# Patient Record
Sex: Female | Born: 1970 | Race: White | Hispanic: No | Smoking: Never smoker
Health system: Southern US, Community
[De-identification: ages and names within clinical notes are randomized; demographics above are authoritative.]

## PROBLEM LIST (undated history)

## (undated) DIAGNOSIS — F419 Anxiety disorder, unspecified: Secondary | ICD-10-CM

## (undated) DIAGNOSIS — K602 Anal fissure, unspecified: Secondary | ICD-10-CM

## (undated) DIAGNOSIS — K589 Irritable bowel syndrome without diarrhea: Secondary | ICD-10-CM

## (undated) HISTORY — DX: Anxiety disorder, unspecified: F41.9

## (undated) HISTORY — PX: HYSTEROSCOPY WITH D & C: SHX1775

## (undated) HISTORY — DX: Anal fissure, unspecified: K60.2

## (undated) HISTORY — DX: Irritable bowel syndrome, unspecified: K58.9

---

## 2001-02-19 ENCOUNTER — Other Ambulatory Visit: Admission: RE | Admit: 2001-02-19 | Discharge: 2001-02-19 | Payer: Self-pay | Admitting: Obstetrics & Gynecology

## 2002-03-10 ENCOUNTER — Other Ambulatory Visit: Admission: RE | Admit: 2002-03-10 | Discharge: 2002-03-10 | Payer: Self-pay | Admitting: Obstetrics and Gynecology

## 2002-08-05 ENCOUNTER — Other Ambulatory Visit: Admission: RE | Admit: 2002-08-05 | Discharge: 2002-08-05 | Payer: Self-pay | Admitting: Obstetrics and Gynecology

## 2002-08-10 ENCOUNTER — Inpatient Hospital Stay (HOSPITAL_COMMUNITY): Admission: AD | Admit: 2002-08-10 | Discharge: 2002-08-10 | Payer: Self-pay | Admitting: Obstetrics and Gynecology

## 2002-08-17 ENCOUNTER — Ambulatory Visit (HOSPITAL_COMMUNITY): Admission: RE | Admit: 2002-08-17 | Discharge: 2002-08-17 | Payer: Self-pay | Admitting: Obstetrics and Gynecology

## 2002-08-17 ENCOUNTER — Encounter: Payer: Self-pay | Admitting: Obstetrics and Gynecology

## 2003-01-19 ENCOUNTER — Inpatient Hospital Stay (HOSPITAL_COMMUNITY): Admission: AD | Admit: 2003-01-19 | Discharge: 2003-01-19 | Payer: Self-pay | Admitting: Obstetrics and Gynecology

## 2003-03-27 ENCOUNTER — Inpatient Hospital Stay (HOSPITAL_COMMUNITY): Admission: AD | Admit: 2003-03-27 | Discharge: 2003-03-28 | Payer: Self-pay | Admitting: Obstetrics and Gynecology

## 2003-11-11 ENCOUNTER — Other Ambulatory Visit: Admission: RE | Admit: 2003-11-11 | Discharge: 2003-11-11 | Payer: Self-pay | Admitting: Obstetrics and Gynecology

## 2004-08-30 ENCOUNTER — Other Ambulatory Visit: Admission: RE | Admit: 2004-08-30 | Discharge: 2004-08-30 | Payer: Self-pay | Admitting: Obstetrics and Gynecology

## 2005-05-30 ENCOUNTER — Ambulatory Visit: Payer: Self-pay | Admitting: Family Medicine

## 2005-12-12 ENCOUNTER — Other Ambulatory Visit: Admission: RE | Admit: 2005-12-12 | Discharge: 2005-12-12 | Payer: Self-pay | Admitting: Obstetrics and Gynecology

## 2007-12-22 ENCOUNTER — Ambulatory Visit (HOSPITAL_COMMUNITY): Admission: RE | Admit: 2007-12-22 | Discharge: 2007-12-22 | Payer: Self-pay | Admitting: Neurosurgery

## 2008-06-25 HISTORY — PX: NECK SURGERY: SHX720

## 2009-09-21 IMAGING — CR DG CERVICAL SPINE 2 OR 3 VIEWS
1 series · 1 of 1 positions shown · non-contrast
Comparison: None

CLINICAL DATA: Cervical spinal stenosis

CERVICAL SPINE - 2-3 VIEW

[view not recorded]
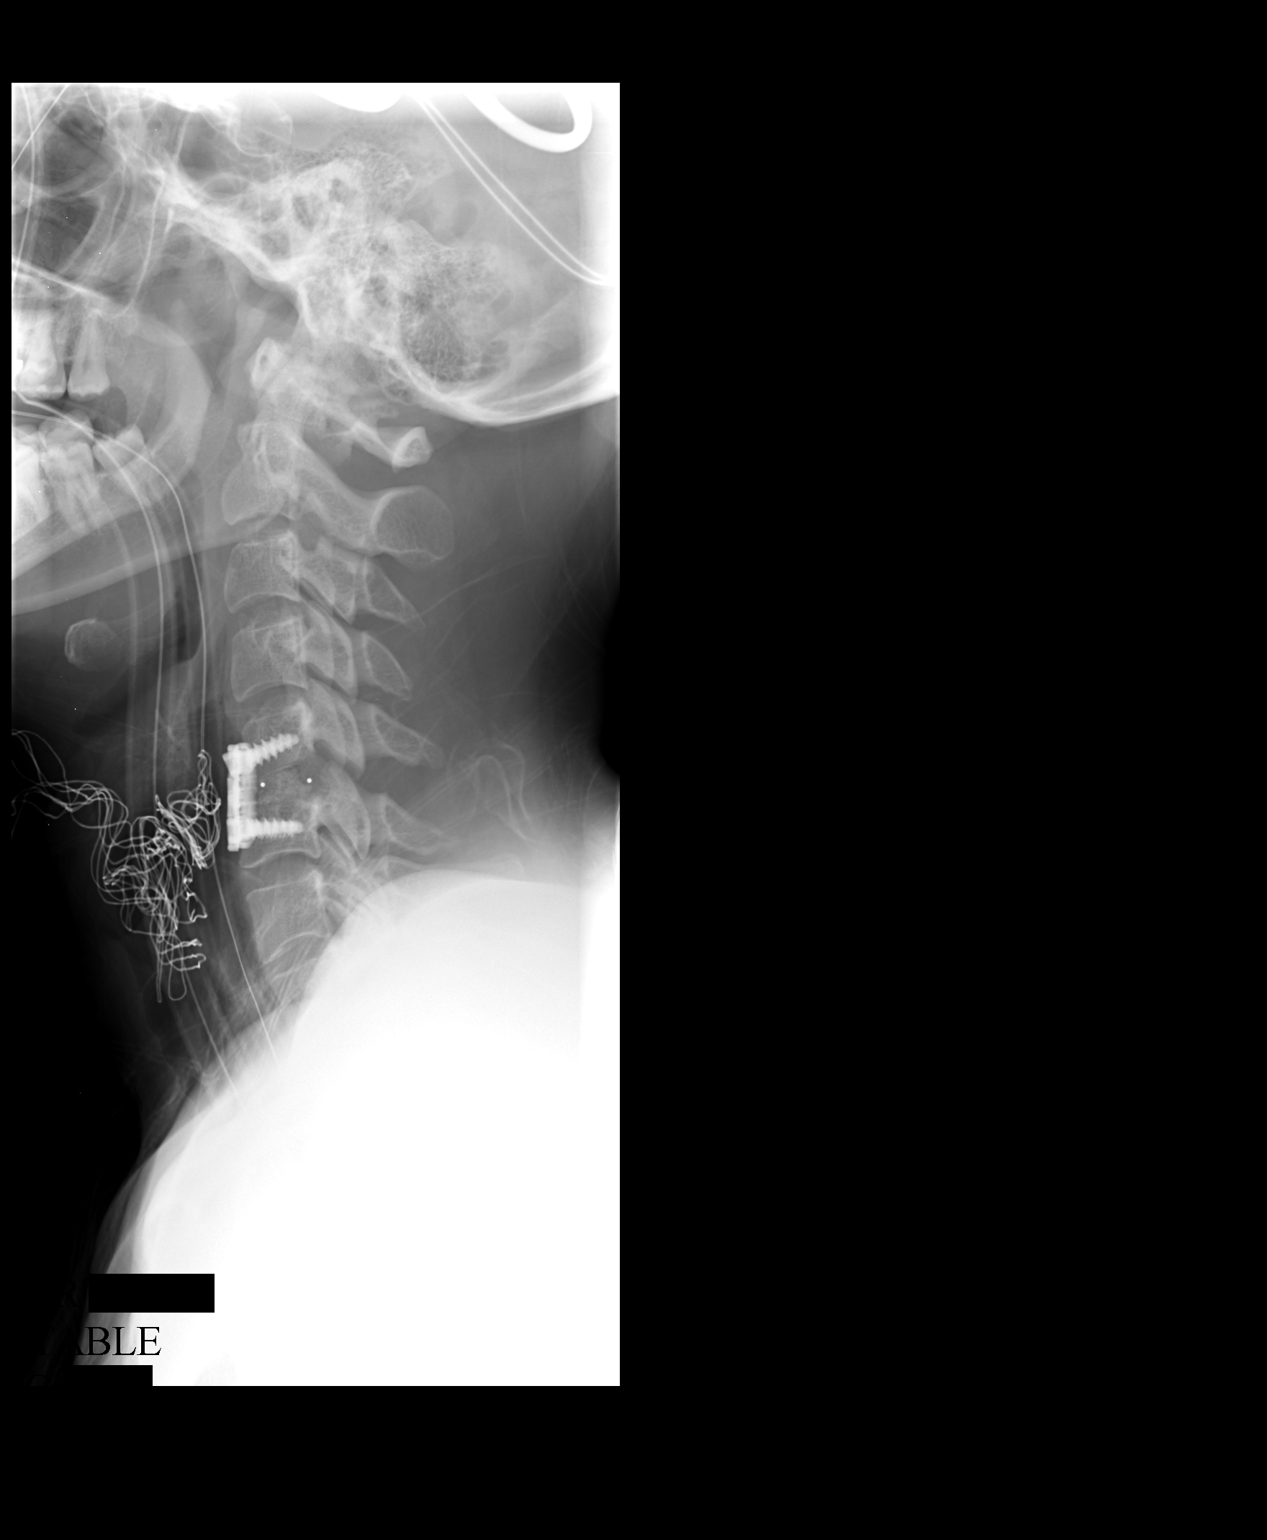

[1 of 1 positions shown; findings below may reference images not displayed]

FINDINGS: The radiograph #1 demonstrates a localization needle
anteriorly at C5-6.  Radiograph #2 at [DATE] a.m. demonstrates the
patient has undergone anterior cervical fusion at C5-6.  Anterior
plate, screws, and interbody fusion device are present.  There is
slight widening of the interspinous distance at  C5-6 posteriorly.
IMPRESSION: Anterior cervical fusion performed at C5-6.

## 2010-11-07 NOTE — Op Note (Signed)
[name], [name]        ACCOUNT NO.:  [id number]   MEDICAL RECORD NO.:  [id number]          PATIENT TYPE:  OIB   LOCATION:  3533                         FACILITY:  [facility]   PHYSICIAN:  Cristi Loron, M.D.DATE OF BIRTH:  [birth year]   DATE OF PROCEDURE:  12/22/2007  DATE OF DISCHARGE:  12/22/2007                               OPERATIVE REPORT   BRIEF HISTORY:  The patient is a 40 year old white female who suffered  from neck and right arm pain consistent with a right C6 radiculopathy.  She failed medical management and worked up with a cervical MRI which  demonstrated herniated disk at C5-6.  I discussed various treatment  options with the patient including surgery.  She has weighed the risks,  benefits, and alternatives of surgery, so I proceeded with C5-6 anterior  cervical diskectomy fusion and plating.   PREOPERATIVE DIAGNOSES:  C5-6 herniated nucleus pulposus, spinal  stenosis, cervical radiculopathy, osteomyelopathy, and cervicalgia.   POSTOPERATIVE DIAGNOSES:  C5-6 herniated nucleus pulposus, spinal  stenosis, cervical radiculopathy, osteomyelopathy, and cervicalgia.   PROCEDURE:  C5-6 extensive anterior cervical diskectomy/decompression;  C5-6 anterior interbody arthrodesis with local autograft bone and active  fused bone graft extender; insertion of C5-6 interbody prosthesis  (Alphatec PEEK interbody prosthesis); C5-6 anterior cervical plating  with Codman slim lock anterior cervical titanium plate and screws.   SURGEON:  Cristi Loron, MD   ASSISTANT:  Stefani Dama, MD   ANESTHESIA:  General endotracheal.   BLOOD LOSS:  50 mL.   SPECIMENS:  None.   DRAINS:  None.   COMPLICATIONS:  None.   DESCRIPTION OF PROCEDURE:  The patient was brought to the operating room  by the anesthesia team.  General endotracheal anesthesia was induced.  The patient remained in supine position.  A roll was placed in her  shoulders to place her neck in slight  extension.  Her anterior cervical  region was then prepared with Betadine scrub and Betadine solution.  Sterile drapes were applied.  I then injected area to be incised with  Marcaine with epinephrine solution.  I used a scalpel to make a  transverse incision in the patient's left anterior neck.  I used the  Metzenbaum scissors to divide the platysma muscle and then to dissect  medial to sternocleidomastoid muscle, jugular vein, and carotid artery.  I carefully dissected down towards the anterior cervical spine  identifying the esophagus and retracting it medially.  I then used the  Kinder swabs to clear the soft tissue from the anterior cervical spine,  and then we inserted a bent spinal needle into the exposed  intervertebral disk space.  We then obtained intraoperative radiograph  to confirm our location.   We then used electrocautery to detach the medial border of the longus  colli muscle bilaterally from C5-6 intervertebral space.  We then  inserted the Caspar self-retaining retractor underneath the longus colli  muscle bilaterally out to provide exposure.  We then incised C5-6  intervertebral disk space with 15-blade scalpel.  I performed a partial  intervertebral diskectomy with the pituitary forceps.  We then inserted  distraction screws into this C5-C6 vertebral  body and distracted  interspace and then used the colonic curettes and pituitary forceps to  remove the intervertebral disk.  We then used high-speed drill to  decorticate the vertebral endplates at C5-6, drill away the remainder of  C5-6 intervertebral disk, drill away some posterior spondylosis, and  then to thin out the posterior longitudinal ligament.  We then incised  the ligament of the arachnoid and removed with Kerrison punch  undercutting of the vertebral endplates at C5-6 decompressing the thecal  sac.  We then performed foraminotomies about the bilateral C6 nerve root  completing the decompression.  We  encountered the herniated disk at the  C5-6 on the right as expected, compressing the right C6 nerve root, we  got a good decompression of the thecal sac and bilateral C6 nerve roots.   We now turned our attention to arthrodesis.  We used trial spacers and  determined to use a 4-mm medium Alphatec PEEK interbody prosthesis.  We  prefilled this prosthesis with combination of local autograft bone.  We  obtained the decompression as well as active fused bone graft extender.  We inserted the prosthesis and distracted C5-6 interspace and then  removed distraction screws.  There was good snug fit of the prosthesis  in the interspace.   We now turned attention to interspinal instrumentation.  We used high-  speed drill to remove some ventral spondylosis from the vertebral  endplates C5-6, so that the plate would lay down flat.  We selected  appropriate length Codman somewhat anterior cervical plate laid it along  the anterior aspect of the tuberosity of C5-C6.  We then drilled two 12-  mm holes at C5-C6 and secured the plate to the vertebral bodies by  placing two 12-mm self-tapping screws at C5-C6.  We then obtained  intraoperative radiographs which demonstrated good position of plate,  screws, and interbody prosthesis.  We then secured the screws and plate  by locking each cam.   We then obtained hemostasis using bipolar cautery, irrigated the wound  out with bacitracin solution.  We then removed the retractor.  We  inspected the esophagus for any damage.  There was none apparent.  We  then reapproximated the patient's platysma muscle with interrupted 3-0  Vicryl suture, subcutaneous tissue with interrupted 3-0 Vicryl suture,  and the skin with Steri-Strips and Benzoin.  The wound was then coated  with bacitracin ointment.  Sterile dressing was applied.  The drapes  were removed and the patient was subsequently extubated by the  anesthesia team and transported to the postanesthesia care  unit in  stable condition.  All sponge, instrument, and needle counts were  correct at the end of this case.      Cristi Loron, M.D.  Electronically Signed     JDJ/MEDQ  D:  12/22/2007  T:  12/23/2007  Job:  [id number]

## 2010-11-10 NOTE — H&P (Signed)
NAME:  Kathryn Roach, Kathryn Roach                    ACCOUNT NO.:  [id number]   MEDICAL RECORD NO.:  [id number]                   PATIENT TYPE:  INP   LOCATION:  9170                                 FACILITY:  [facility]   PHYSICIAN:  Naima A. Dillard, M.D.              DATE OF BIRTH:  27-Nov-1970   DATE OF ADMISSION:  03/27/2003  DATE OF DISCHARGE:                                HISTORY & PHYSICAL   This is a 40 year old gravida 3, para 2-0-0-2, at 39-3/7 weeks, who  presented to the hospital with complaints of regular uterine contractions x1  hour and irregular contractions for six hours.  She denied leaking or  bleeding and reported positive fetal movement.  She was complaining of  rectal pressure upon admission.  Pregnancy has been remarkable for:   1. First trimester bleeding.  2. History of HSV.  3. History of rapid labor.  4. History of irregular cycles.  5. Family history of neural tube defect.  6. Rh negative.  7. Group B strep negative.    PAST OBSTETRICAL HISTORY:  Remarkable for a vaginal delivery in 1997 of a  female infant weighing 7 pounds 7 ounces at 40 weeks' gestation, remarkable  for a 10% abruption.  She had a vaginal delivery in 2000 of a female infant  at 58 weeks' gestation weighing 6 pounds 14 ounces, remarkable for a  prolapsed cervix.   PRENATAL LABORATORY DATA:  Hemoglobin 12.2, platelets 223.  Blood type O  negative.  Antibody screen positive for RhoGAM.  RPR nonreactive.  Rubella  immune.  HBSAG negative.  HIV negative.  Pap test normal.  Gonorrhea  negative.  Chlamydia negative.  Quad screen normal.  Glucola normal.  Group  B strep negative.   PAST MEDICAL HISTORY:  Remarkable for history of postpartum depression after  her second baby.  History of Rh negative blood, for which she has received  RhoGAM.  History of abnormal Pap one year ago.  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  History of migraines when she was in her 16s.   PAST SURGICAL HISTORY:   Remarkable for dental work.   FAMILY HISTORY:  Remarkable for a grandfather with heart disease, a  grandmother with hypertension, a grandmother with varicosities, aunt with a  blood clotting disorder, mother with depression, brother with alcohol and  tobacco abuse.   GENETIC HISTORY:  Remarkable for father of the baby's uncles, whose children  were born with holes in their hearts, the patient's brother, who was born  with a hole in his heart, and a brother with spina bifida.   SOCIAL HISTORY:  The patient is married to [employer], who is involved and  supportive.  She works as a Runner, broadcasting/film/video.  She is of the [employer].  She denies any alcohol, tobacco, or drug use.   OBJECTIVE:  VITAL SIGNS:  Stable.  HEENT:  Within normal limits.  NECK:  Thyroid  normal, not enlarged.  CHEST:  Clear to auscultation.  CARDIAC:  Regular rate and rhythm.  ABDOMEN:  Gravid.  EFM initially showed a reassuring fetal heart rate with  variable decelerations, with contractions and uterine contractions every 1-  1/2 to two minutes.  PELVIC:  Cervical exam on admission was 8-9 cm, completely effaced, 0  station, and vertex presentation.   She progressed quickly to complete dilation within five minutes and pushed  over four contractions for spontaneous vaginal delivery at 0056 hours.  Occiput anterior over an intact perineum.  There was no difficulty with  shoulders.  No nuchal cord.  Apgars 9 and 9.  Placenta was delivered  spontaneously and intact with a three-vessel cord.  The cervix prolapsed  into the vagina after delivery but was easily replaced with bimanual  compression and no increased bleeding.  Left periurethral labial laceration  was not bleeding and not repaired, and a small first degree perineal  laceration was repaired with 3-0 Vicryl Rapide.  EBL less than 100 mL.   ASSESSMENT:  1. Intrauterine pregnancy at 39-0/7 weeks.  2. Status post spontaneous vaginal delivery of a female infant,  Apgars 9 and     9.   PLAN:  1. Admit to birthing suites and then transfer to mother-baby as needed.  2. Routine C.N.M. orders.  3. Will defer Pitocin unless bleeding increases.  4. Routine postpartum orders.     Marie L. Williams, C.N.M.                 Naima A. Normand Sloop, M.D.    MLW/MEDQ  D:  03/27/2003  T:  03/27/2003  Job:  [id number]

## 2011-03-22 LAB — CBC
HCT: 39.5
Hemoglobin: 13.7
MCHC: 34.7
MCV: 88.7
Platelets: 325
RBC: 4.46
RDW: 12.6
WBC: 8.3

## 2018-01-20 ENCOUNTER — Encounter: Payer: Self-pay | Admitting: Gastroenterology

## 2018-02-18 ENCOUNTER — Encounter: Payer: Self-pay | Admitting: Gastroenterology

## 2018-02-28 ENCOUNTER — Encounter (INDEPENDENT_AMBULATORY_CARE_PROVIDER_SITE_OTHER): Payer: Self-pay

## 2018-02-28 ENCOUNTER — Ambulatory Visit (INDEPENDENT_AMBULATORY_CARE_PROVIDER_SITE_OTHER): Payer: BC Managed Care – PPO | Admitting: Gastroenterology

## 2018-02-28 ENCOUNTER — Encounter: Payer: Self-pay | Admitting: Gastroenterology

## 2018-02-28 VITALS — BP 140/90 | HR 85 | Ht 68.0 in | Wt 138.5 lb

## 2018-02-28 DIAGNOSIS — K625 Hemorrhage of anus and rectum: Secondary | ICD-10-CM

## 2018-02-28 DIAGNOSIS — K589 Irritable bowel syndrome without diarrhea: Secondary | ICD-10-CM

## 2018-02-28 DIAGNOSIS — Z8371 Family history of colonic polyps: Secondary | ICD-10-CM

## 2018-02-28 DIAGNOSIS — K582 Mixed irritable bowel syndrome: Secondary | ICD-10-CM | POA: Diagnosis not present

## 2018-02-28 NOTE — Patient Instructions (Signed)
If you are age 47 or older, your body mass index should be between 23-30. Your Body mass index is 21.06 kg/m. If this is out of the aforementioned range listed, please consider follow up with your Primary Care Provider.  If you are age 29 or younger, your body mass index should be between 19-25. Your Body mass index is 21.06 kg/m. If this is out of the aformentioned range listed, please consider follow up with your Primary Care Provider.   You have been scheduled for a colonoscopy. Please follow written instructions given to you at your visit today.  Please pick up your prep supplies at the pharmacy within the next 1-3 days. If you use inhalers (even only as needed), please bring them with you on the day of your procedure. Your physician has requested that you go to www.startemmi.com and enter the access code given to you at your visit today. This web site gives a general overview about your procedure. However, you should still follow specific instructions given to you by our office regarding your preparation for the procedure.  Please purchase the following medications over the counter and take as directed: Calcium once daily.   Thank you,  Dr. Lynann Bologna

## 2018-02-28 NOTE — Progress Notes (Signed)
Chief Complaint: Change in bowel habits  Referring Provider:  Dr Leota Jacobsen and Sterling Big PA     ASSESSMENT AND PLAN;   #1. IBS with alt diarhea and constipation (predominant constipation)  #2. Rectal bleeding (Differential diagnosis includes hemorrhoids, AVMs, colitis, polyps, rule out colonic neoplasms or IBD)  #3. FH polyps < 60 (dad)  - Proceed with colonoscopy with miralax prep.  I have discussed the risks and benefits.  The risks including risk of perforation requiring laparotomy, bleeding after polypectomy requiring blood transfusions and risks of anesthesia/sedation were discussed.  Rare risks of missing colorectal neoplasms were also discussed.  Alternatives were given.  Patient is fully aware and agrees to proceed. All the questions were answered. Colonoscopy will be scheduled in upcoming days.  Patient is to report immediately if there is any significant weight loss or excessive bleeding until then. Consent forms were given for review. -Can proceed with GYN work-up.  Patient has appointment with Dr. Leota Jacobsen for questionable right-sided adnexal cyst   HPI:    Kathryn Roach is a 47 y.o. female  Ref by Sterling Big PA With long H/O constipation -with pellet-like stools, at times would have 1 bowel movement a week, has tried multiple medications including and Amitiza without any relief.  This has changed Now with occ diarrhea Started before she she started taking magnesium supplements and antibiotics for strep throat.. Has generalized abdominal discomfort and crampy pain at times. Also has been having occasional rectal bleeding-bright red in color, at times mixed with the stool, no tenesmus. Had normal labs -CBC, TSH and CMP per patient.  Had TVUS - R fibroid/adnexal cyst -has appointment with Dr Leota Jacobsen.  Has been advised to get colonoscopy performed.  Her dad had polyps below the age of 47.  She also has history of rectal prolapse, vaginal prolapse and  bladder prolapse in the past.  Additional social history: Works in [employer], 2 daughters (2000, 2440), son 2004 Past Medical History:  Diagnosis Date  . Anal fissure   . Anxiety   . IBS (irritable bowel syndrome)     Past Surgical History:  Procedure Laterality Date  . NECK SURGERY  2010   disk that was pressing  spinal column    Family History  Problem Relation Age of Onset  . Uterine cancer Mother   . Colon polyps Father        everyone on dad side has colon polyps per pt  . High blood pressure Father   . High Cholesterol Father   . Skin cancer Sister   . Heart attack Paternal Grandfather   . Clotting disorder Paternal Aunt   . Clotting disorder Daughter     Social History   Tobacco Use  . Smoking status: Never Smoker  . Smokeless tobacco: Never Used  Substance Use Topics  . Alcohol use: Never    Frequency: Never  . Drug use: Never    Current Outpatient Medications  Medication Sig Dispense Refill  . amoxicillin (AMOXIL) 875 MG tablet Take 1 tablet by mouth 2 (two) times daily.    . Carboxymethylcellulose Sodium (EYE DROPS OP) Apply 1 drop to eye daily.    . cetirizine (ZYRTEC) 10 MG tablet Take 10 mg by mouth daily.    Marland Kitchen FLUoxetine (PROZAC) 40 MG capsule Take 40 mg by mouth daily.    . magnesium gluconate (MAGONATE) 500 MG tablet Take 500 mg by mouth daily.    . Multiple Vitamins-Minerals (MULTIVITAMIN ADULT PO) Take 1 tablet  by mouth daily.    Marland Kitchen POTASSIUM BICARBONATE PO Take 1 tablet by mouth daily.    . Vitamin D, Ergocalciferol, (DRISDOL) 50000 units CAPS capsule Take 1 capsule by mouth once a week.     No current facility-administered medications for this visit.     Allergies  Allergen Reactions  . Flexeril [Cyclobenzaprine]     Gets a rash  . Prednisone     Unable to see    Review of Systems:  Constitutional: Denies fever, chills, diaphoresis, appetite change and fatigue.  HEENT: Denies photophobia, eye pain, redness, hearing loss, ear  pain, congestion, sore throat, rhinorrhea, sneezing, mouth sores, neck pain, neck stiffness and tinnitus.   Respiratory: Denies SOB, DOE, cough, chest tightness,  and wheezing.   Cardiovascular: Denies chest pain, palpitations and leg swelling.  Genitourinary: Denies dysuria, urgency, frequency, hematuria, flank pain and difficulty urinating.  Musculoskeletal: Denies myalgias, back pain, joint swelling, arthralgias and gait problem.  Skin: No rash.  Neurological: Denies dizziness, seizures, syncope, weakness, light-headedness, numbness and headaches.  Hematological: Denies adenopathy. Easy bruising, personal or family bleeding history  Psychiatric/Behavioral: No anxiety or depression     Physical Exam:    BP 140/90   Pulse 85   Ht 5\' 8"  (1.727 m)   Wt 138 lb 8 oz (62.8 kg)   BMI 21.06 kg/m  Filed Weights   02/28/18 1549  Weight: 138 lb 8 oz (62.8 kg)   Constitutional:  Well-developed, in no acute distress. Psychiatric: Normal mood and affect. Behavior is normal. HEENT: Pupils normal.  Conjunctivae are normal. No scleral icterus. Neck supple.  Cardiovascular: Normal rate, regular rhythm. No edema Pulmonary/chest: Effort normal and breath sounds normal. No wheezing, rales or rhonchi. Abdominal: Soft, nondistended. Nontender. Bowel sounds active throughout. There are no masses palpable. No hepatomegaly. Rectal:  defered Neurological: Alert and oriented to person place and time. Skin: Skin is warm and dry. No rashes noted.  Data Reviewed: I have personally reviewed following labs and imaging studies  CBC: CBC 12/18/2007  WBC 8.3  Hemoglobin 13.7  Hematocrit 39.5  Platelets 325  Seen in presence of Trisha Williams CMA     Edman Circle, MD 02/28/2018, 4:14 PM  Cc: No ref. provider found

## 2018-03-25 HISTORY — PX: COLONOSCOPY: SHX174

## 2018-04-14 ENCOUNTER — Ambulatory Visit (AMBULATORY_SURGERY_CENTER): Payer: BC Managed Care – PPO | Admitting: Gastroenterology

## 2018-04-14 ENCOUNTER — Encounter: Payer: Self-pay | Admitting: Gastroenterology

## 2018-04-14 VITALS — BP 124/76 | HR 75 | Temp 99.3°F | Resp 10 | Ht 68.0 in | Wt 138.0 lb

## 2018-04-14 DIAGNOSIS — K589 Irritable bowel syndrome without diarrhea: Secondary | ICD-10-CM | POA: Diagnosis present

## 2018-04-14 DIAGNOSIS — K629 Disease of anus and rectum, unspecified: Secondary | ICD-10-CM

## 2018-04-14 DIAGNOSIS — R197 Diarrhea, unspecified: Secondary | ICD-10-CM | POA: Diagnosis not present

## 2018-04-14 MED ORDER — SODIUM CHLORIDE 0.9 % IV SOLN: 500.0000 mL | Freq: Once | INTRAVENOUS | Status: DC

## 2018-04-14 NOTE — Progress Notes (Signed)
Pt's states no medical or surgical changes since previsit or office visit. 

## 2018-04-14 NOTE — Patient Instructions (Signed)
Handout:  Hemorrhoids   YOU HAD AN ENDOSCOPIC PROCEDURE TODAY AT THE [facility]:   Refer to the procedure report that was given to you for any specific questions about what was found during the examination.  If the procedure report does not answer your questions, please call your gastroenterologist to clarify.  If you requested that your care partner not be given the details of your procedure findings, then the procedure report has been included in a sealed envelope for you to review at your convenience later.  YOU SHOULD EXPECT: Some feelings of bloating in the abdomen. Passage of more gas than usual.  Walking can help get rid of the air that was put into your GI tract during the procedure and reduce the bloating. If you had a lower endoscopy (such as a colonoscopy or flexible sigmoidoscopy) you may notice spotting of blood in your stool or on the toilet paper. If you underwent a bowel prep for your procedure, you may not have a normal bowel movement for a few days.  Please Note:  You might notice some irritation and congestion in your nose or some drainage.  This is from the oxygen used during your procedure.  There is no need for concern and it should clear up in a day or so.  SYMPTOMS TO REPORT IMMEDIATELY:   Following lower endoscopy (colonoscopy or flexible sigmoidoscopy):  Excessive amounts of blood in the stool  Significant tenderness or worsening of abdominal pains  Swelling of the abdomen that is new, acute  Fever of 100F or higher  For urgent or emergent issues, a gastroenterologist can be reached at any hour by calling [phone].   DIET:  We do recommend a small meal at first, but then you may proceed to your regular diet.  Drink plenty of fluids but you should avoid alcoholic beverages for 24 hours.  ACTIVITY:  You should plan to take it easy for the rest of today and you should NOT DRIVE or use heavy machinery until tomorrow (because of the sedation medicines  used during the test).    FOLLOW UP: Our staff will call the number listed on your records the next business day following your procedure to check on you and address any questions or concerns that you may have regarding the information given to you following your procedure. If we do not reach you, we will leave a message.  However, if you are feeling well and you are not experiencing any problems, there is no need to return our call.  We will assume that you have returned to your regular daily activities without incident.  If any biopsies were taken you will be contacted by phone or by letter within the next 1-3 weeks.  Please call us at [phone] if you have not heard about the biopsies in 3 weeks.    SIGNATURES/CONFIDENTIALITY: You and/or your care partner have signed paperwork which will be entered into your electronic medical record.  These signatures attest to the fact that that the information above on your After Visit Summary has been reviewed and is understood.  Full responsibility of the confidentiality of this discharge information lies with you and/or your care-partner.

## 2018-04-14 NOTE — Progress Notes (Signed)
To PACU, VSS. Report to [email or web address] 

## 2018-04-14 NOTE — Progress Notes (Signed)
Called to room to assist during endoscopic procedure.  Patient ID and intended procedure confirmed with present staff. Received instructions for my participation in the procedure from the performing physician.  

## 2018-04-14 NOTE — Op Note (Signed)
[facility] Patient Name: Kathryn Roach Procedure Date: 04/14/2018 4:10 PM MRN: [id number] Endoscopist: Lynann Bologna , MD Age: 47 Referring MD:  Date of Birth: September 19, 1970 Gender: Female Account #: [id number] Procedure:                Colonoscopy Indications:              #1. Colon cancer screening in patient at increased                            risk: Family history of 1st-degree relative with                            colon polyps before age 23 years. #2. IBS with alt                            diarhea and constipation. #3. H/O Rectal bleeding                            (resolved). Medicines:                Monitored Anesthesia Care Procedure:                Pre-Anesthesia Assessment:                           - Prior to the procedure, a History and Physical                            was performed, and patient medications and                            allergies were reviewed. The patient's tolerance of                            previous anesthesia was also reviewed. The risks                            and benefits of the procedure and the sedation                            options and risks were discussed with the patient.                            All questions were answered, and informed consent                            was obtained. Prior Anticoagulants: The patient has                            taken no previous anticoagulant or antiplatelet                            agents. ASA Grade Assessment: I - A normal, healthy  patient. After reviewing the risks and benefits,                            the patient was deemed in satisfactory condition to                            undergo the procedure.                           After obtaining informed consent, the colonoscope                            was passed under direct vision. Throughout the                            procedure, the patient's blood pressure, pulse, and                        oxygen saturations were monitored continuously. The                            Model PCF-H190DL [id number]) scope was introduced                            through the anus and advanced to the 2 cm into the                            ileum. The colonoscopy was performed without                            difficulty. The patient tolerated the procedure                            well. The quality of the bowel preparation was                            excellent. The colon was highly redundant. Scope In: 4:16:24 PM Scope Out: 4:41:50 PM Scope Withdrawal Time: 0 hours 19 minutes 6 seconds  Total Procedure Duration: 0 hours 25 minutes 26 seconds  Findings:                 A healed anal fissure and small internal                            hemorrhoids was found on perianal exam and                            retroflexed examination..                           A localized area of mildly erythematous mucosa was                            found in the distal rectum upto 5 cm from the anal  verge. No ulcers or erosions. Biopsies were taken                            with a cold forceps for histology.                           The colon (entire remaining examined portion)                            appeared normal. Biopsies for histology were taken                            with a cold forceps tp r/o microscopic colitis.                            Estimated blood loss: none.                           The terminal ileum appeared normal.                           The exam was otherwise without abnormality on                            direct and retroflexion views. Complications:            No immediate complications. Estimated Blood Loss:     Estimated blood loss: none. Impression:               -Minimal rectal erythema-likely due to preparation.                            (Biopsied)                           -Healed posterior anal fissure.                            -Small internal hemorrhoids                           -Otherwise normal colonoscopy to TI. Recommendation:           - Patient has a contact number available for                            emergencies. The signs and symptoms of potential                            delayed complications were discussed with the                            patient. Return to normal activities tomorrow.                            Written discharge instructions were provided to the  patient.                           - High fiber diet.                           - Continue present medications.                           - Await pathology results.                           - Patient has a contact number available for                            emergencies. The signs and symptoms of potential                            delayed complications were discussed with the                            patient. Return to normal activities tomorrow.                            Written discharge instructions were provided to the                            patient.                           - Repeat colonoscopy in 5 years for screening                            purposes due to strong family history of advanced                            colonic polyps.                           - Return to GI clinic PRN. Lynann Bologna, MD 04/14/2018 4:51:28 PM This report has been signed electronically.

## 2018-04-15 ENCOUNTER — Telehealth: Payer: Self-pay

## 2018-04-15 ENCOUNTER — Telehealth: Payer: Self-pay | Admitting: *Deleted

## 2018-04-15 NOTE — Telephone Encounter (Signed)
No answer, left message to call if questlons or concerns.

## 2018-04-15 NOTE — Telephone Encounter (Signed)
Second follow up phone call attempt, no answer, message left 

## 2018-04-21 ENCOUNTER — Encounter: Payer: Self-pay | Admitting: Gastroenterology

## 2019-10-05 ENCOUNTER — Other Ambulatory Visit: Payer: Self-pay

## 2019-10-05 MED ORDER — FLUOXETINE HCL 40 MG PO CAPS: 40.0000 mg | ORAL_CAPSULE | Freq: Every day | ORAL | 0 refills | Status: DC

## 2019-10-21 ENCOUNTER — Encounter: Payer: Self-pay | Admitting: Legal Medicine

## 2019-10-21 ENCOUNTER — Other Ambulatory Visit: Payer: Self-pay

## 2019-10-21 ENCOUNTER — Ambulatory Visit: Payer: BC Managed Care – PPO | Admitting: Legal Medicine

## 2019-10-21 VITALS — BP 122/80 | HR 94 | Temp 98.2°F | Resp 16 | Ht 68.0 in | Wt 145.0 lb

## 2019-10-21 DIAGNOSIS — K589 Irritable bowel syndrome without diarrhea: Secondary | ICD-10-CM | POA: Diagnosis not present

## 2019-10-21 DIAGNOSIS — F900 Attention-deficit hyperactivity disorder, predominantly inattentive type: Secondary | ICD-10-CM

## 2019-10-21 DIAGNOSIS — F411 Generalized anxiety disorder: Secondary | ICD-10-CM | POA: Diagnosis not present

## 2019-10-21 HISTORY — DX: Generalized anxiety disorder: F41.1

## 2019-10-21 HISTORY — DX: Attention-deficit hyperactivity disorder, predominantly inattentive type: F90.0

## 2019-10-21 MED ORDER — FLUOXETINE HCL 40 MG PO CAPS: 40.0000 mg | ORAL_CAPSULE | Freq: Every day | ORAL | 2 refills | Status: DC

## 2019-10-21 NOTE — Progress Notes (Signed)
Established Patient Office Visit  Subjective:  Patient ID: Kathryn Roach, female    DOB: Nov 21, 1970  Age: 49 y.o. MRN: [id number]  CC:  Chief Complaint  Patient presents with  . Anxiety    follow up  . Depression    HPI Kathryn Roach presents for depression.  Patient has IBS. It is doing well  This patient has situational depression for 1 years.  PHQ9 =6.  Patient is having less anhedonia.  The patient has improved future plans and prospects.  The depression is worse with stress.  The patient is exercising and working on behavior to improve mental health.  Patient is not seeing a therapist or psychiatrist.  na  Patient is on fluoxetine.  Past Medical History:  Diagnosis Date  . Anal fissure   . Anxiety   . 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 Generalized anxiety disorder 10/21/2019  . IBS (irritable bowel syndrome)     Past Surgical History:  Procedure Laterality Date  . NECK SURGERY  2010   disk that was pressing  spinal column    Family History  Problem Relation Age of Onset  . Uterine cancer Mother   . Colon polyps Father        everyone on dad side has colon polyps per pt  . High blood pressure Father   . High Cholesterol Father   . Skin cancer Sister   . Heart attack Paternal Grandfather   . Clotting disorder Paternal Aunt   . Clotting disorder Daughter   . Colon cancer Neg Hx   . Esophageal cancer Neg Hx     Social History   Socioeconomic History  . Marital status: Married    Spouse name: Not on file  . Number of children: 5  . Years of education: Not on file  . Highest education level: Not on file  Occupational History  . Occupation: Pharmacist, hospital  Tobacco Use  . Smoking status: Never Smoker  . Smokeless tobacco: Never Used  Substance and Sexual Activity  . Alcohol use: Never  . Drug use: Never  . Sexual activity: Not Currently  Other Topics Concern  . Not on file  Social History  Narrative  . Not on file   Social Determinants of Health   Financial Resource Strain:   . Difficulty of Paying Living Expenses:   Food Insecurity:   . Worried About Charity fundraiser in the Last Year:   . Arboriculturist in the Last Year:   Transportation Needs:   . Film/video editor (Medical):   Marland Kitchen Lack of Transportation (Non-Medical):   Physical Activity:   . Days of Exercise per Week:   . Minutes of Exercise per Session:   Stress:   . Feeling of Stress :   Social Connections:   . Frequency of Communication with Friends and Family:   . Frequency of Social Gatherings with Friends and Family:   . Attends Religious Services:   . Active Member of Clubs or Organizations:   . Attends Archivist Meetings:   Marland Kitchen Marital Status:   Intimate Partner Violence:   . Fear of Current or Ex-Partner:   . Emotionally Abused:   Marland Kitchen Physically Abused:   . Sexually Abused:     Outpatient Medications Prior to Visit  Medication Sig Dispense Refill  . Calcium-Magnesium-Vitamin D (CALCIUM 1200+D3 PO) Take by mouth.    . Carboxymethylcellulose Sodium (EYE DROPS OP) Apply 1 drop to eye daily.    Marland Kitchen  cetirizine (ZYRTEC) 10 MG tablet Take 10 mg by mouth daily.    . magnesium gluconate (MAGONATE) 500 MG tablet Take 500 mg by mouth daily.    Marland Kitchen POTASSIUM BICARBONATE PO Take 1 tablet by mouth daily.    . vitamin B-12 (CYANOCOBALAMIN) 500 MCG tablet Take 500 mcg by mouth daily.    . Vitamin D, Ergocalciferol, (DRISDOL) 50000 units CAPS capsule Take 1 capsule by mouth once a week.    Marland Kitchen FLUoxetine (PROZAC) 40 MG capsule Take 1 capsule (40 mg total) by mouth daily. 30 capsule 0  . Multiple Vitamins-Minerals (MULTIVITAMIN ADULT PO) Take 1 tablet by mouth daily.     No facility-administered medications prior to visit.    Allergies  Allergen Reactions  . Flexeril [Cyclobenzaprine] Rash    Gets a rash  . Prednisone Rash    Unable to see    ROS Review of Systems  Constitutional: Negative.     HENT: Negative.   Eyes: Negative.   Respiratory: Negative.   Cardiovascular: Negative.   Gastrointestinal: Negative.   Genitourinary: Negative.   Musculoskeletal: Negative.   Skin: Negative.   Neurological: Negative.   Psychiatric/Behavioral: Negative.       Objective:    Physical Exam  Constitutional: She is oriented to person, place, and time. She appears well-developed and well-nourished.  HENT:  Head: Normocephalic and atraumatic.  Eyes: Pupils are equal, round, and reactive to light. Conjunctivae and EOM are normal.  Cardiovascular: Normal rate, regular rhythm, normal heart sounds and intact distal pulses.  Pulmonary/Chest: Effort normal and breath sounds normal.  Neurological: She is alert and oriented to person, place, and time. She has normal reflexes.  Psychiatric: She has a normal mood and affect. Her behavior is normal. Judgment and thought content normal.  Vitals reviewed.   BP 122/80 (BP Location: Right Arm, Patient Position: Sitting)   Pulse 94   Temp 98.2 F (36.8 C) (Temporal)   Resp 16   Ht '5\' 8"'  (1.727 m)   Wt 145 lb (65.8 kg)   SpO2 98%   BMI 22.05 kg/m  Wt Readings from Last 3 Encounters:  10/21/19 145 lb (65.8 kg)  04/14/18 138 lb (62.6 kg)  02/28/18 138 lb 8 oz (62.8 kg)     Health Maintenance Due  Topic Date Due  . HIV Screening  Never done  . PAP SMEAR-Modifier  Never done  . MAMMOGRAM  06/26/2015    There are no preventive care reminders to display for this patient.  No results found for: TSH Lab Results  Component Value Date   WBC 8.3 12/18/2007   HGB 13.7 12/18/2007   HCT 39.5 12/18/2007   MCV 88.7 12/18/2007   PLT 325 12/18/2007   No results found for: NA, K, CHLORIDE, CO2, GLUCOSE, BUN, CREATININE, BILITOT, ALKPHOS, AST, ALT, PROT, ALBUMIN, CALCIUM, ANIONGAP, EGFR, GFR No results found for: CHOL No results found for: HDL No results found for: LDLCALC No results found for: TRIG No results found for: CHOLHDL No results  found for: HGBA1C    Assessment & Plan:   Problem List Items Addressed This Visit      Digestive   IBS (irritable bowel syndrome)    AN INDIVIDUAL CARE PLAN was established and reinforced today.  The patient's status was assessed using clinical findings on exam, labs, and other diagnostic testing. Patient's success at meeting treatment goals based on disease specific evidence-bassed guidelines and found to be in good control. RECOMMENDATIONS include maintaining present medicines and treatment.  Other   Generalized anxiety disorder - Primary    Doing well on fluoxetine.  Less mood changes.  Continue medicines.      Relevant Medications   FLUoxetine (PROZAC) 40 MG capsule      Meds ordered this encounter  Medications  . FLUoxetine (PROZAC) 40 MG capsule    Sig: Take 1 capsule (40 mg total) by mouth daily.    Dispense:  90 capsule    Refill:  2    Follow-up: Return in about 6 months (around 04/21/2020).    Reinaldo Meeker, MD

## 2019-10-21 NOTE — Assessment & Plan Note (Signed)
AN INDIVIDUAL CARE PLAN was established and reinforced today.  The patient's status was assessed using clinical findings on exam, labs, and other diagnostic testing. Patient's success at meeting treatment goals based on disease specific evidence-bassed guidelines and found to be in good control. RECOMMENDATIONS include maintaining present medicines and treatment. 

## 2019-10-21 NOTE — Assessment & Plan Note (Signed)
Doing well on fluoxetine.  Less mood changes.  Continue medicines.

## 2020-02-05 ENCOUNTER — Ambulatory Visit: Payer: BC Managed Care – PPO | Admitting: Legal Medicine

## 2020-02-05 ENCOUNTER — Encounter: Payer: Self-pay | Admitting: Legal Medicine

## 2020-02-05 ENCOUNTER — Other Ambulatory Visit: Payer: Self-pay

## 2020-02-05 VITALS — BP 140/92 | HR 84 | Temp 97.3°F | Resp 17 | Ht 68.0 in | Wt 141.6 lb

## 2020-02-05 DIAGNOSIS — F418 Other specified anxiety disorders: Secondary | ICD-10-CM

## 2020-02-05 DIAGNOSIS — J0100 Acute maxillary sinusitis, unspecified: Secondary | ICD-10-CM | POA: Diagnosis not present

## 2020-02-05 DIAGNOSIS — J329 Chronic sinusitis, unspecified: Secondary | ICD-10-CM | POA: Insufficient documentation

## 2020-02-05 MED ORDER — ALPRAZOLAM 0.5 MG PO TABS
0.5000 mg | ORAL_TABLET | Freq: Two times a day (BID) | ORAL | 1 refills | Status: DC | PRN
Start: 2020-02-05 — End: 2022-03-06

## 2020-02-05 MED ORDER — ALPRAZOLAM 0.5 MG PO TABS: 0.5000 mg | ORAL_TABLET | Freq: Two times a day (BID) | ORAL | 1 refills | Status: DC | PRN

## 2020-02-05 MED ORDER — ALPRAZOLAM 0.5 MG PO TBDP: 0.5000 mg | ORAL_TABLET | Freq: Three times a day (TID) | ORAL | 1 refills | Status: DC | PRN

## 2020-02-05 NOTE — Progress Notes (Addendum)
Subjective:  Patient ID: Kathryn Roach, female    DOB: [birth year]  Age: 49 y.o. MRN: [id number]  Chief Complaint  Patient presents with  . Anxiety    Patient feels anxious  . Headache    Pressure on frontal area, occipital area and parietal area;  . Hypertension    Had 163/112  last monday    HPI:  Situational depression from suicide in family recently.  Much sleep disturbance and crying.  Mother just died and  She was on plne.  Sister was into heropin and OD.  Feels guilty, More headaches.   Current Outpatient Medications on File Prior to Visit  Medication Sig Dispense Refill  . Calcium-Magnesium-Vitamin D (CALCIUM 1200+D3 PO) Take by mouth.    . fexofenadine (ALLEGRA) 180 MG tablet Take 180 mg by mouth daily.    Marland Kitchen FLUoxetine (PROZAC) 40 MG capsule Take 1 capsule (40 mg total) by mouth daily. 90 capsule 2  . magnesium gluconate (MAGONATE) 500 MG tablet Take 500 mg by mouth daily.    . naproxen (NAPROSYN) 250 MG tablet Take by mouth.    . vitamin B-12 (CYANOCOBALAMIN) 500 MCG tablet Take 500 mcg by mouth daily.    . Vitamin D, Ergocalciferol, (DRISDOL) 50000 units CAPS capsule Take 1 capsule by mouth once a week.     No current facility-administered medications on file prior to visit.   Past Medical History:  Diagnosis Date  . Anal fissure   . Anxiety   . 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 Generalized anxiety disorder 10/21/2019  . IBS (irritable bowel syndrome)    Past Surgical History:  Procedure Laterality Date  . NECK SURGERY  2010   disk that was pressing  spinal column    Family History  Problem Relation Age of Onset  . Uterine cancer Mother   . Colon polyps Father        everyone on dad side has colon polyps per pt  . High blood pressure Father   . High Cholesterol Father   . Skin cancer Sister   . Heart attack Paternal Grandfather   . Clotting disorder Paternal Aunt   . Clotting disorder Daughter   .  Colon cancer Neg Hx   . Esophageal cancer Neg Hx    Social History   Socioeconomic History  . Marital status: Married    Spouse name: Not on file  . Number of children: 5  . Years of education: Not on file  . Highest education level: Not on file  Occupational History  . Occupation: Runner, broadcasting/film/video  Tobacco Use  . Smoking status: Never Smoker  . Smokeless tobacco: Never Used  Vaping Use  . Vaping Use: Never used  Substance and Sexual Activity  . Alcohol use: Never  . Drug use: Never  . Sexual activity: Not Currently  Other Topics Concern  . Not on file  Social History Narrative  . Not on file   Social Determinants of Health   Financial Resource Strain:   . Difficulty of Paying Living Expenses:   Food Insecurity:   . Worried About Programme researcher, broadcasting/film/video in the Last Year:   . Barista in the Last Year:   Transportation Needs:   . Freight forwarder (Medical):   Marland Kitchen Lack of Transportation (Non-Medical):   Physical Activity:   . Days of Exercise per Week:   . Minutes of Exercise per Session:   Stress:   . Feeling of Stress :  Social Connections:   . Frequency of Communication with Friends and Family:   . Frequency of Social Gatherings with Friends and Family:   . Attends Religious Services:   . Active Member of Clubs or Organizations:   . Attends Banker Meetings:   Marland Kitchen Marital Status:     Review of Systems  Constitutional: Negative.   HENT: Negative.   Eyes: Negative.   Respiratory: Negative.   Cardiovascular: Negative.   Gastrointestinal: Negative.   Endocrine: Negative.   Genitourinary: Negative.   Musculoskeletal: Negative.   Skin: Negative.   Neurological: Positive for headaches.  Psychiatric/Behavioral: Positive for sleep disturbance.     Objective:  BP (!) 140/92 (BP Location: Right Arm, Patient Position: Sitting)   Pulse 84   Temp (!) 97.3 F (36.3 C) (Temporal)   Resp 17   Ht 5\' 8"  (1.727 m)   Wt 141 lb 9.6 oz (64.2 kg)   SpO2  98%   BMI 21.53 kg/m   BP/Weight 02/05/2020 10/21/2019 04/14/2018  Systolic BP 140 122 124  Diastolic BP 92 80 76  Wt. (Lbs) 141.6 145 138  BMI 21.53 22.05 20.98    Physical Exam Vitals reviewed.  Constitutional:      Appearance: She is well-developed.  HENT:     Head: Normocephalic.     Right Ear: Tympanic membrane, ear canal and external ear normal.     Left Ear: Tympanic membrane, ear canal and external ear normal.     Nose: Nose normal.     Mouth/Throat:     Mouth: Mucous membranes are moist.  Eyes:     Extraocular Movements: Extraocular movements intact.     Pupils: Pupils are equal, round, and reactive to light.  Cardiovascular:     Rate and Rhythm: Normal rate and regular rhythm.     Heart sounds: Normal heart sounds.  Pulmonary:     Effort: Pulmonary effort is normal.     Breath sounds: Normal breath sounds.  Abdominal:     General: Bowel sounds are normal.     Palpations: Abdomen is soft.  Musculoskeletal:     Cervical back: Normal range of motion and neck supple.  Skin:    General: Skin is warm and dry.     Capillary Refill: Capillary refill takes less than 2 seconds.  Neurological:     General: No focal deficit present.     Mental Status: She is alert and oriented to person, place, and time.  Psychiatric:     Comments: Situation depression    Depression screen [facility] 2/9 02/08/2020 10/21/2019  Decreased Interest 2 1  Down, Depressed, Hopeless 1 1  PHQ - 2 Score 3 2  Altered sleeping 2 1  Tired, decreased energy 2 1  Change in appetite 2 1  Feeling bad or failure about yourself  1 1  Trouble concentrating 0 0  Moving slowly or fidgety/restless 0 0  Suicidal thoughts - 0  PHQ-9 Score 10 6  Difficult doing work/chores Somewhat difficult Somewhat difficult      Lab Results  Component Value Date   WBC 8.3 12/18/2007   HGB 13.7 12/18/2007   HCT 39.5 12/18/2007   PLT 325 12/18/2007      Assessment & Plan:   1. Acute non-recurrent maxillary  sinusitis Patient is doing well with her sinus congestion  2. Situational anxiety  Patient has recent family OD and she is very depressed, PHQ 6 treat with alprazolam to encourage less anxiety and sleep disturnbance.  Meds ordered this encounter  Medications  . DISCONTD: ALPRAZolam (NIRAVAM) 0.5 MG dissolvable tablet    Sig: Take 1 tablet (0.5 mg total) by mouth 3 (three) times daily as needed for anxiety.    Dispense:  60 tablet    Refill:  1  . DISCONTD: ALPRAZolam (XANAX) 0.5 MG tablet    Sig: Take 1 tablet (0.5 mg total) by mouth 2 (two) times daily as needed for anxiety.    Dispense:  60 tablet    Refill:  1  . ALPRAZolam (XANAX) 0.5 MG tablet    Sig: Take 1 tablet (0.5 mg total) by mouth 2 (two) times daily as needed for anxiety.    Dispense:  60 tablet    Refill:  1       Follow-up: Return in about 2 weeks (around 02/19/2020) for anxiety.  An After Visit Summary was printed and given to the patient.  Brent Bulla [facility] [phone]

## 2020-07-31 ENCOUNTER — Other Ambulatory Visit: Payer: Self-pay | Admitting: Legal Medicine

## 2020-07-31 DIAGNOSIS — F411 Generalized anxiety disorder: Secondary | ICD-10-CM

## 2021-04-18 ENCOUNTER — Other Ambulatory Visit: Payer: Self-pay

## 2021-04-18 ENCOUNTER — Encounter: Payer: Self-pay | Admitting: Legal Medicine

## 2021-04-18 ENCOUNTER — Ambulatory Visit: Payer: BC Managed Care – PPO | Admitting: Legal Medicine

## 2021-04-18 DIAGNOSIS — L259 Unspecified contact dermatitis, unspecified cause: Secondary | ICD-10-CM | POA: Insufficient documentation

## 2021-04-18 DIAGNOSIS — L247 Irritant contact dermatitis due to plants, except food: Secondary | ICD-10-CM

## 2021-04-18 MED ORDER — TRIAMCINOLONE ACETONIDE 40 MG/ML IJ SUSP: 80.0000 mg | Freq: Once | INTRAMUSCULAR | Status: DC

## 2021-04-18 NOTE — Progress Notes (Signed)
Acute Office Visit  Subjective:    Patient ID: Kathryn Roach, female    DOB: 01/11/1971, 50 y.o.   MRN: [id number]  Chief Complaint  Patient presents with   Rash    HPI: Patient is in today for contact dermatitis from plants. She was working in garden. Rash on forearms, neck and legs.  She has vesicular rash that is pruritic.  Past Medical History:  Diagnosis Date   Anal fissure    Anxiety    Attention-deficit hyperactivity disorder, predominantly inattentive type 10/21/2019   Generalized anxiety disorder 10/21/2019   IBS (irritable bowel syndrome)     Past Surgical History:  Procedure Laterality Date   NECK SURGERY  2010   disk that was pressing  spinal column    Family History  Problem Relation Age of Onset   Uterine cancer Mother    Colon polyps Father        everyone on dad side has colon polyps per pt   High blood pressure Father    High Cholesterol Father    Skin cancer Sister    Heart attack Paternal Grandfather    Clotting disorder Paternal Aunt    Clotting disorder Daughter    Colon cancer Neg Hx    Esophageal cancer Neg Hx     Social History   Socioeconomic History   Marital status: Married    Spouse name: Not on file   Number of children: 5   Years of education: Not on file   Highest education level: Not on file  Occupational History   Occupation: Pharmacist, hospital  Tobacco Use   Smoking status: Never   Smokeless tobacco: Never  Vaping Use   Vaping Use: Never used  Substance and Sexual Activity   Alcohol use: Never   Drug use: Never   Sexual activity: Not Currently  Other Topics Concern   Not on file  Social History Narrative   Not on file   Social Determinants of Health   Financial Resource Strain: Not on file  Food Insecurity: Not on file  Transportation Needs: Not on file  Physical Activity: Not on file  Stress: Not on file  Social Connections: Not on file  Intimate Partner Violence: Not on file    Outpatient Medications Prior  to Visit  Medication Sig Dispense Refill   ALPRAZolam (XANAX) 0.5 MG tablet Take 1 tablet (0.5 mg total) by mouth 2 (two) times daily as needed for anxiety. 60 tablet 1   Calcium-Magnesium-Vitamin D (CALCIUM 1200+D3 PO) Take by mouth.     fexofenadine (ALLEGRA) 180 MG tablet Take 180 mg by mouth daily.     FLUoxetine (PROZAC) 40 MG capsule TAKE 1 CAPSULE(40 MG) BY MOUTH DAILY 90 capsule 2   magnesium gluconate (MAGONATE) 500 MG tablet Take 500 mg by mouth daily.     naproxen (NAPROSYN) 250 MG tablet Take by mouth.     vitamin B-12 (CYANOCOBALAMIN) 500 MCG tablet Take 500 mcg by mouth daily.     Vitamin D, Ergocalciferol, (DRISDOL) 50000 units CAPS capsule Take 1 capsule by mouth once a week.     No facility-administered medications prior to visit.    Allergies  Allergen Reactions   Flexeril [Cyclobenzaprine] Rash    Gets a rash   Prednisone Rash    Unable to see    Review of Systems  Constitutional:  Negative for fatigue.  HENT:  Negative for congestion, ear pain and sore throat.   Respiratory:  Negative for cough and shortness of  breath.   Cardiovascular:  Negative for chest pain.  Gastrointestinal:  Negative for abdominal pain, constipation, diarrhea, nausea and vomiting.  Genitourinary:  Negative for dysuria, frequency and urgency.  Musculoskeletal:  Negative for arthralgias, back pain and myalgias.  Skin:  Positive for rash.       On the right side of neck, right leg, chest  Neurological:  Negative for dizziness and headaches.  Psychiatric/Behavioral:  Negative for agitation and sleep disturbance. The patient is not nervous/anxious.       Objective:    Physical Exam Vitals reviewed.  Constitutional:      Appearance: Normal appearance.  HENT:     Head: Normocephalic and atraumatic.     Right Ear: Tympanic membrane normal.     Left Ear: Tympanic membrane normal.     Nose: Nose normal.     Mouth/Throat:     Mouth: Mucous membranes are moist.     Pharynx: Oropharynx  is clear.  Eyes:     Extraocular Movements: Extraocular movements intact.     Conjunctiva/sclera: Conjunctivae normal.     Pupils: Pupils are equal, round, and reactive to light.  Cardiovascular:     Rate and Rhythm: Normal rate and regular rhythm.     Pulses: Normal pulses.     Heart sounds: No murmur heard.   No gallop.  Pulmonary:     Effort: Pulmonary effort is normal. No respiratory distress.     Breath sounds: Normal breath sounds. No wheezing.  Abdominal:     General: Abdomen is flat. Bowel sounds are normal. There is no distension.     Tenderness: There is no abdominal tenderness.  Musculoskeletal:        General: Normal range of motion.     Cervical back: Normal range of motion.  Skin:    General: Skin is warm.     Capillary Refill: Capillary refill takes less than 2 seconds.  Neurological:     General: No focal deficit present.     Mental Status: She is alert and oriented to person, place, and time. Mental status is at baseline.  Psychiatric:        Mood and Affect: Mood normal.        Behavior: Behavior normal.    BP 108/72 (BP Location: Right Arm, Patient Position: Sitting, Cuff Size: Normal)   Pulse 99   Temp 97.9 F (36.6 C) (Temporal)   Ht $R'5\' 8"'Tn$  (1.727 m)   Wt 142 lb (64.4 kg)   SpO2 98%   BMI 21.59 kg/m  Wt Readings from Last 3 Encounters:  04/18/21 142 lb (64.4 kg)  02/05/20 141 lb 9.6 oz (64.2 kg)  10/21/19 145 lb (65.8 kg)    Health Maintenance Due  Topic Date Due   HIV Screening  Never done   Hepatitis C Screening  Never done   PAP SMEAR-Modifier  Never done   MAMMOGRAM  06/26/2015   TETANUS/TDAP  11/08/2020   Zoster Vaccines- Shingrix (1 of 2) Never done   INFLUENZA VACCINE  01/23/2021    There are no preventive care reminders to display for this patient.   No results found for: TSH Lab Results  Component Value Date   WBC 8.3 12/18/2007   HGB 13.7 12/18/2007   HCT 39.5 12/18/2007   MCV 88.7 12/18/2007   PLT 325 12/18/2007   No  results found for: NA, K, CHLORIDE, CO2, GLUCOSE, BUN, CREATININE, BILITOT, ALKPHOS, AST, ALT, PROT, ALBUMIN, CALCIUM, ANIONGAP, EGFR, GFR No results found for:  CHOL No results found for: HDL No results found for: LDLCALC No results found for: TRIG No results found for: CHOLHDL No results found for: HGBA1C     Assessment & Plan:   Problem List Items Addressed This Visit       Musculoskeletal and Integument   Contact dermatitis   Relevant Medications   triamcinolone acetonide (KENALOG-40) injection 80 mg  Contact dermatitis from plants, treat with injection and calamine       Follow-up: Return if symptoms worsen or fail to improve.  An After Visit Summary was printed and given to the patient.  Reinaldo Meeker, MD [facility] [phone]

## 2021-05-01 ENCOUNTER — Other Ambulatory Visit: Payer: Self-pay | Admitting: Legal Medicine

## 2021-05-01 DIAGNOSIS — F411 Generalized anxiety disorder: Secondary | ICD-10-CM

## 2021-05-02 ENCOUNTER — Ambulatory Visit: Payer: BC Managed Care – PPO | Admitting: Legal Medicine

## 2021-05-02 ENCOUNTER — Encounter: Payer: Self-pay | Admitting: Legal Medicine

## 2021-05-02 ENCOUNTER — Other Ambulatory Visit: Payer: Self-pay

## 2021-05-02 DIAGNOSIS — T7840XA Allergy, unspecified, initial encounter: Secondary | ICD-10-CM | POA: Diagnosis not present

## 2021-05-02 NOTE — Progress Notes (Signed)
Established Patient Office Visit  Subjective:  Patient ID: Kathryn Roach, female    DOB: 09/28/1970  Age: 50 y.o. MRN: [id number]  CC:  Chief Complaint  Patient presents with   Rash    Rash x 10 days presented 3 days after her having a kenalog shot. Patient states she has been taking zyrtec 2 in the a.m. and 2 in the p.m for the past 3 days. Which has helped some.    HPI Kathryn Roach presents for rash on arms groin since Kenalog shot.  It is resolving. Less written.  Past Medical History:  Diagnosis Date   Anal fissure    Anxiety    Attention-deficit hyperactivity disorder, predominantly inattentive type 10/21/2019   Generalized anxiety disorder 10/21/2019   IBS (irritable bowel syndrome)     Past Surgical History:  Procedure Laterality Date   NECK SURGERY  2010   disk that was pressing  spinal column    Family History  Problem Relation Age of Onset   Uterine cancer Mother    Colon polyps Father        everyone on dad side has colon polyps per pt   High blood pressure Father    High Cholesterol Father    Skin cancer Sister    Heart attack Paternal Grandfather    Clotting disorder Paternal Aunt    Clotting disorder Daughter    Colon cancer Neg Hx    Esophageal cancer Neg Hx     Social History   Socioeconomic History   Marital status: Married    Spouse name: Not on file   Number of children: 5   Years of education: Not on file   Highest education level: Not on file  Occupational History   Occupation: Pharmacist, hospital  Tobacco Use   Smoking status: Never   Smokeless tobacco: Never  Vaping Use   Vaping Use: Never used  Substance and Sexual Activity   Alcohol use: Never   Drug use: Never   Sexual activity: Not Currently  Other Topics Concern   Not on file  Social History Narrative   Not on file   Social Determinants of Health   Financial Resource Strain: Not on file  Food Insecurity: Not on file  Transportation Needs: Not on file  Physical  Activity: Not on file  Stress: Not on file  Social Connections: Not on file  Intimate Partner Violence: Not on file    Outpatient Medications Prior to Visit  Medication Sig Dispense Refill   ALPRAZolam (XANAX) 0.5 MG tablet Take 1 tablet (0.5 mg total) by mouth 2 (two) times daily as needed for anxiety. 60 tablet 1   Calcium-Magnesium-Vitamin D (CALCIUM 1200+D3 PO) Take by mouth.     fexofenadine (ALLEGRA) 180 MG tablet Take 180 mg by mouth daily.     FLUoxetine (PROZAC) 40 MG capsule TAKE 1 CAPSULE(40 MG) BY MOUTH DAILY 90 capsule 2   magnesium gluconate (MAGONATE) 500 MG tablet Take 500 mg by mouth daily.     naproxen (NAPROSYN) 250 MG tablet Take by mouth.     vitamin B-12 (CYANOCOBALAMIN) 500 MCG tablet Take 500 mcg by mouth daily.     Vitamin D, Ergocalciferol, (DRISDOL) 50000 units CAPS capsule Take 1 capsule by mouth once a week.     Facility-Administered Medications Prior to Visit  Medication Dose Route Frequency Provider Last Rate Last Admin   triamcinolone acetonide (KENALOG-40) injection 80 mg  80 mg Intramuscular Once Lillard Anes, MD  Allergies  Allergen Reactions   Flexeril [Cyclobenzaprine] Rash    Gets a rash   Kenalog [Triamcinolone] Itching   Prednisone Rash    Unable to see    ROS Review of Systems  Constitutional:  Negative for activity change and appetite change.  HENT:  Negative for congestion.   Eyes:  Negative for visual disturbance.  Respiratory:  Negative for chest tightness and shortness of breath.   Cardiovascular:  Negative for chest pain, palpitations and leg swelling.  Gastrointestinal:  Negative for abdominal distention and abdominal pain.  Endocrine: Negative.   Genitourinary:  Negative for difficulty urinating and dysuria.  Musculoskeletal:  Negative for arthralgias and back pain.  Skin: Negative.   Neurological: Negative.   Psychiatric/Behavioral:  Negative for agitation.      Objective:    Physical Exam Vitals  reviewed.  Constitutional:      General: She is not in acute distress.    Appearance: Normal appearance.  HENT:     Right Ear: Tympanic membrane normal.     Left Ear: Tympanic membrane normal.  Eyes:     Extraocular Movements: Extraocular movements intact.     Conjunctiva/sclera: Conjunctivae normal.     Pupils: Pupils are equal, round, and reactive to light.  Cardiovascular:     Rate and Rhythm: Normal rate and regular rhythm.     Pulses: Normal pulses.     Heart sounds: Normal heart sounds. No murmur heard.   No gallop.  Pulmonary:     Effort: Pulmonary effort is normal. No respiratory distress.     Breath sounds: Normal breath sounds. No wheezing.  Abdominal:     General: Abdomen is flat. Bowel sounds are normal.     Palpations: Abdomen is soft.  Musculoskeletal:     Cervical back: Normal range of motion.  Skin:    Findings: Erythema and rash present.  Neurological:     General: No focal deficit present.     Mental Status: She is alert and oriented to person, place, and time.    BP 136/68   Pulse 85   Temp (!) 97.1 F (36.2 C)   Ht '5\' 8"'  (1.727 m)   Wt 145 lb (65.8 kg)   SpO2 100%   BMI 22.05 kg/m  Wt Readings from Last 3 Encounters:  05/02/21 145 lb (65.8 kg)  04/18/21 142 lb (64.4 kg)  02/05/20 141 lb 9.6 oz (64.2 kg)     Health Maintenance Due  Topic Date Due   HIV Screening  Never done   Hepatitis C Screening  Never done   PAP SMEAR-Modifier  Never done   MAMMOGRAM  06/26/2015   TETANUS/TDAP  11/08/2020   Zoster Vaccines- Shingrix (1 of 2) Never done   INFLUENZA VACCINE  01/23/2021    There are no preventive care reminders to display for this patient.  No results found for: TSH Lab Results  Component Value Date   WBC 8.3 12/18/2007   HGB 13.7 12/18/2007   HCT 39.5 12/18/2007   MCV 88.7 12/18/2007   PLT 325 12/18/2007   No results found for: NA, K, CHLORIDE, CO2, GLUCOSE, BUN, CREATININE, BILITOT, ALKPHOS, AST, ALT, PROT, ALBUMIN, CALCIUM,  ANIONGAP, EGFR, GFR No results found for: CHOL No results found for: HDL No results found for: LDLCALC No results found for: TRIG No results found for: CHOLHDL No results found for: HGBA1C    Assessment & Plan:   Diagnoses and all orders for this visit: Allergic rash present on examination  Patient's rash on arms and legs fading with zyrtec and benedryl   Follow-up: Return if symptoms worsen or fail to improve.    Reinaldo Meeker, MD

## 2021-06-25 HISTORY — PX: HYSTEROSCOPY WITH D & C: SHX1775

## 2021-12-21 NOTE — Progress Notes (Signed)
Acute Office Visit  Subjective:    Patient ID: Kathryn Roach, female    DOB: 1971/05/29, 51 y.o.   MRN: [id number]  Chief Complaint  Patient presents with   Allergic Reaction    HPI: Patient is in today for pruritic rash to bilateral arms and trunk. Denies fever, chills, recent URI. States she developed ras from possible trumpet vine exposure while performing yard work 1-week ago. She was treated at [facility] with Dexamethasone injections and Triamcinolone cream. States rash worsened afterwards. Stopped Triamcinolone cream due to burning skin and swelling. Treatment has included Benadryl, Hydroxyzine, and Zyrtec with minimal relief. She is allergic to Prednisone-causes rash.She tried to get an appt with Dr Lucie Leather, allergy specialist, no appt available until Sept 2023.  Past Medical History:  Diagnosis Date   Anal fissure    Anxiety    Attention-deficit hyperactivity disorder, predominantly inattentive type 10/21/2019   Generalized anxiety disorder 10/21/2019   IBS (irritable bowel syndrome)     Past Surgical History:  Procedure Laterality Date   NECK SURGERY  2010   disk that was pressing  spinal column    Family History  Problem Relation Age of Onset   Uterine cancer Mother    Colon polyps Father        everyone on dad side has colon polyps per pt   High blood pressure Father    High Cholesterol Father    Skin cancer Sister    Heart attack Paternal Grandfather    Clotting disorder Paternal Aunt    Clotting disorder Daughter    Colon cancer Neg Hx    Esophageal cancer Neg Hx     Social History   Socioeconomic History   Marital status: Married    Spouse name: Not on file   Number of children: 5   Years of education: Not on file   Highest education level: Not on file  Occupational History   Occupation: Runner, broadcasting/film/video  Tobacco Use   Smoking status: Never   Smokeless tobacco: Never  Vaping Use   Vaping Use: Never used  Substance and Sexual Activity   Alcohol use: Never    Drug use: Never   Sexual activity: Not Currently  Other Topics Concern   Not on file  Social History Narrative   Not on file   Social Determinants of Health   Financial Resource Strain: Not on file  Food Insecurity: Not on file  Transportation Needs: Not on file  Physical Activity: Not on file  Stress: Not on file  Social Connections: Not on file  Intimate Partner Violence: Not on file    Outpatient Medications Prior to Visit  Medication Sig Dispense Refill   ALPRAZolam (XANAX) 0.5 MG tablet Take 1 tablet (0.5 mg total) by mouth 2 (two) times daily as needed for anxiety. 60 tablet 1   Calcium-Magnesium-Vitamin D (CALCIUM 1200+D3 PO) Take by mouth.     fexofenadine (ALLEGRA) 180 MG tablet Take 180 mg by mouth daily.     FLUoxetine (PROZAC) 40 MG capsule TAKE 1 CAPSULE(40 MG) BY MOUTH DAILY 90 capsule 2   magnesium gluconate (MAGONATE) 500 MG tablet Take 500 mg by mouth daily.     naproxen (NAPROSYN) 250 MG tablet Take by mouth.     vitamin B-12 (CYANOCOBALAMIN) 500 MCG tablet Take 500 mcg by mouth daily.     Vitamin D, Ergocalciferol, (DRISDOL) 50000 units CAPS capsule Take 1 capsule by mouth once a week.     Facility-Administered Medications Prior to Visit  Medication Dose Route Frequency Provider Last Rate Last Admin   triamcinolone acetonide (KENALOG-40) injection 80 mg  80 mg Intramuscular Once Abigail Miyamoto, MD        Allergies  Allergen Reactions   Flexeril [Cyclobenzaprine] Rash    Gets a rash   Kenalog [Triamcinolone] Itching   Prednisone Rash    Unable to see    Review of Systems See pertinent positives and negatives per HPI.     Objective:    Physical Exam Skin:    Findings: Erythema and rash present. Rash is vesicular.          Comments: Vesicular rash with erythema to bilateral upper extremities and torso     BP (!) 150/90   Pulse 98   Temp (!) 96.8 F (36 C)   Ht 5\' 8"  (1.727 m)   Wt 145 lb (65.8 kg)   SpO2 99%   BMI 22.05 kg/m    Wt Readings from Last 3 Encounters:  05/02/21 145 lb (65.8 kg)  04/18/21 142 lb (64.4 kg)  02/05/20 141 lb 9.6 oz (64.2 kg)    Health Maintenance Due  Topic Date Due   HIV Screening  Never done   Hepatitis C Screening  Never done   PAP SMEAR-Modifier  Never done   MAMMOGRAM  06/26/2015   TETANUS/TDAP  11/08/2020   Zoster Vaccines- Shingrix (1 of 2) Never done        Lab Results  Component Value Date   WBC 8.3 12/18/2007   HGB 13.7 12/18/2007   HCT 39.5 12/18/2007   MCV 88.7 12/18/2007   PLT 325 12/18/2007        Assessment & Plan:   1. Irritant contact dermatitis due to plants, except food - Ambulatory referral to Dermatology  -PT SENT URGENTLY TO [facility] FOR WORK-IN APPT   Follow-up: PRN  An After Visit Summary was printed and given to the patient.  12/20/2007, NP [facility] [phone]

## 2021-12-22 ENCOUNTER — Encounter: Payer: Self-pay | Admitting: Nurse Practitioner

## 2021-12-22 ENCOUNTER — Ambulatory Visit: Payer: BC Managed Care – PPO | Admitting: Nurse Practitioner

## 2021-12-22 VITALS — BP 150/90 | HR 98 | Temp 96.8°F | Ht 68.0 in | Wt 145.0 lb

## 2021-12-22 DIAGNOSIS — L247 Irritant contact dermatitis due to plants, except food: Secondary | ICD-10-CM | POA: Diagnosis not present

## 2022-02-10 ENCOUNTER — Other Ambulatory Visit: Payer: Self-pay | Admitting: Legal Medicine

## 2022-02-10 DIAGNOSIS — F411 Generalized anxiety disorder: Secondary | ICD-10-CM

## 2022-03-06 ENCOUNTER — Encounter: Payer: Self-pay | Admitting: Allergy

## 2022-03-06 ENCOUNTER — Ambulatory Visit (INDEPENDENT_AMBULATORY_CARE_PROVIDER_SITE_OTHER): Payer: BC Managed Care – PPO | Admitting: Allergy

## 2022-03-06 VITALS — BP 124/82 | HR 76 | Resp 16 | Ht 68.0 in | Wt 144.1 lb

## 2022-03-06 DIAGNOSIS — Z888 Allergy status to other drugs, medicaments and biological substances status: Secondary | ICD-10-CM | POA: Diagnosis not present

## 2022-03-06 DIAGNOSIS — T50905D Adverse effect of unspecified drugs, medicaments and biological substances, subsequent encounter: Secondary | ICD-10-CM

## 2022-03-06 NOTE — Progress Notes (Signed)
New Patient Note  RE: Kathryn Roach MRN: [id number] DOB: Feb 01, 1971 Date of Office Visit: 03/06/2022  Primary care provider: Abigail Miyamoto, MD  Chief Complaint: medication allergy  History of present illness: Kathryn Roach is a 51 y.o. female presenting today for evaluation of medication allergy.  She is a former pt of the practice last seeing Dr. Lucie Leather more than 3 years ago.    She states her PCP, gynecologist and dermatologist all recommended she come back for allergy evaluation regarding medication reactions.     She states she had an allergic reaction to steroids.  She can't take prednisone due to it increasing her ocular pressure and states it was so bad that she had a period of blindness after prednisone use.  She avoids this.    She does react to poison ivy.  She recalls developing full body hives after receiving a kenalog injections for treatment of poison ivy.    Most recently (about 2-3 months ago) she had another poison ivy reaction where she developed blisters from the poison ivy.  The rash was on her arms, legs and face.  She saw her PCP regarding this and states received a dexamethasone injection as well as topical triamcinolone to use.  She states about 12 hours later she had a different type of rash than the poison ivy (this poison ivy rash improved) as well as hives. She also states her neck and arms swelled in the areas where she applied the triamcinolone. She states also her nose felt like it could bleed and her gums also felt this way.  She denies any GI or respiratory symptoms.   She went to [facility] where she states her BP was 212/150 and states she received benadryl.  The BP did improve she states to around the 150s systolic.  She then saw her dermatologist for this steroid induced rash and they provided her samples of Cibinqo which she took for about 2 weeks and this did help the rash resolve but it took about a month to fully resolve.   She states she  is not going to be working in the yard anymore for fear of developing a poison ivy rash and she now have nothing to use to treat it.     Review of systems in the past 4 weeks: Review of Systems  Constitutional: Negative.   HENT: Negative.    Eyes: Negative.   Respiratory: Negative.    Cardiovascular: Negative.   Gastrointestinal: Negative.   Musculoskeletal: Negative.   Skin: Negative.   Allergic/Immunologic: Negative.   Neurological: Negative.     All other systems negative unless noted above in HPI  Past medical history: Past Medical History:  Diagnosis Date   Anal fissure    Anxiety    Attention-deficit hyperactivity disorder, predominantly inattentive type 10/21/2019   Generalized anxiety disorder 10/21/2019   IBS (irritable bowel syndrome)     Past surgical history: Past Surgical History:  Procedure Laterality Date   NECK SURGERY  2010   disk that was pressing  spinal column    Family history:  Family History  Problem Relation Age of Onset   Uterine cancer Mother    Colon polyps Father        everyone on dad side has colon polyps per pt   High blood pressure Father    High Cholesterol Father    Skin cancer Sister    Heart attack Paternal Grandfather    Clotting disorder Paternal Aunt  Clotting disorder Daughter    Colon cancer Neg Hx    Esophageal cancer Neg Hx     Social history: Lives in a home without carpeting with gas heating and central cooling.  Dog in the home.  Chickens and cat outside the home.  No concern for roaches in the home.  There is concern for water damage, mildew in the home.  She is a Runner, broadcasting/film/video.  Denies a smoking history.     Medication List: Current Outpatient Medications  Medication Sig Dispense Refill   Calcium-Magnesium-Vitamin D (CALCIUM 1200+D3 PO) Take by mouth.     FLUoxetine (PROZAC) 40 MG capsule TAKE 1 CAPSULE(40 MG) BY MOUTH DAILY 30 capsule 0   magnesium gluconate (MAGONATE) 500 MG tablet Take 500 mg by mouth daily.      vitamin B-12 (CYANOCOBALAMIN) 500 MCG tablet Take 500 mcg by mouth daily.     Vitamin D, Ergocalciferol, (DRISDOL) 50000 units CAPS capsule Take 1 capsule by mouth once a week.     Current Facility-Administered Medications  Medication Dose Route Frequency Provider Last Rate Last Admin   triamcinolone acetonide (KENALOG-40) injection 80 mg  80 mg Intramuscular Once Abigail Miyamoto, MD        Known medication allergies: Allergies  Allergen Reactions   Dexamethasone     Rash   Flexeril [Cyclobenzaprine] Rash    Gets a rash   Kenalog [Triamcinolone] Itching   Prednisone Rash    Unable to see     Physical examination: Blood pressure 124/82, pulse 76, resp. rate 16, height 5\' 8"  (1.727 m), weight 144 lb 1.6 oz (65.4 kg), SpO2 99 %.  General: Alert, interactive, in no acute distress. HEENT: PERRLA, TMs pearly gray, turbinates non-edematous without discharge, post-pharynx non erythematous. Neck: Supple without lymphadenopathy. Lungs: Clear to auscultation without wheezing, rhonchi or rales. {no increased work of breathing. CV: Normal S1, S2 without murmurs. Abdomen: Nondistended, nontender. Skin: Erythematous splotches on the neck . Extremities:  No clubbing, cyanosis or edema. Neuro:   Grossly intact.  Diagnositics/Labs: None today   Assessment and plan: Adverse effects of medications - she has had side effect with prednisone use with increase in ocular pressure leading to transient blindness.  Recommend no further prednisone use.  Does not need any testing for this as this was a non-IgE mediated side effect  - symptoms following use of Kenalog injection does appear to be IgE-mediated  - symptoms following use of Dexamethasone injection and topical Triamcinolone.  Not sure which medication or both are culprits in symptoms she developed and she had some IgE-mediated sounding symptoms with the hives and swelling and non-IgE symptoms with the blood pressure spike.    -  discussed with her that IgE-mediated steroid allergy does exist but is rare and most are only reactive to 1 steroid and can tolerate others.   She now has had 2 different steroids via diff routes (injection vs topical) with likely IgE-mediated symptoms and possible contact dermatitis related to the topical triamicinolone use.   - discussed would like to see if there are steroid options she could tolerate since she does have a poison ivy dermatitis and treatment of choice if severe are steroids.  It also would be nice to identify a safe steroid for other purposes.  We will need to arrange for skin testing with possible patch testing if skin testing is negative  Follow-up for testing  I appreciate the opportunity to take part in Carrin's care. Please do not hesitate to contact  me with questions.  Sincerely,   Prudy Feeler, MD Allergy/Immunology [facility]

## 2022-03-06 NOTE — Patient Instructions (Addendum)
Adverse effects of medications - she has had side effect with prednisone use with increase in ocular pressure leading to transient blindness.  Recommend no further prednisone use.  Does not need any testing for this as this was a non-IgE mediated side effect  - symptoms following use of Kenalog injection does appear to be IgE-mediated  - symptoms following use of Dexamethasone injection and topical Triamcinolone.  Not sure which medication or both are culprits in symptoms she developed and she had some IgE-mediated sounding symptoms with the hives and swelling and non-IgE symptoms with the blood pressure spike.    - discussed with her that IgE-mediated steroid allergy does exist but is rare and most are only reactive to 1 steroid and can tolerate others.   She now has had 2 different steroids via diff routes (injection vs topical) with likely IgE-mediated symptoms and possible contact dermatitis related to the topical triamicinolone use.   - discussed would like to see if there are steroid options she could tolerate since she does have a poison ivy dermatitis and treatment of choice if severe are steroids.  It also would be nice to identify a safe steroid for other purposes.  We will need to arrange for skin testing with possible patch testing if skin testing is negative  Follow-up for testing

## 2022-03-09 ENCOUNTER — Telehealth: Payer: Self-pay

## 2022-03-09 NOTE — Telephone Encounter (Signed)
Letter was created and signed by Dr. Delorse Lek. Patient informed. Patient requests for letter to be mailed out to her.

## 2022-03-09 NOTE — Telephone Encounter (Signed)
-----   Message from [facility] Larose Hires, MD sent at 03/06/2022  1:00 PM EDT ----- Regarding: letter Please create into a letter ----------------------------------------------------   I am seeing Kathryn Roach at the [facility] for adverse reactions to corticosteroid medications. These adverse reactions have occurred with prednisone, dexamethasone and triamcinolone.  At this time since she has had adverse events related to multiple corticosteroids which all belong to different corticosteroid class groups would recommend at this time avoidance of corticosteroids until able to identify ones safe to use.   Feel free to contact my office with any questions.  Sincerely,      Margo Aye, MD [facility]

## 2022-08-13 ENCOUNTER — Encounter: Payer: Self-pay | Admitting: Physician Assistant

## 2022-08-13 ENCOUNTER — Ambulatory Visit: Payer: BC Managed Care – PPO | Admitting: Physician Assistant

## 2022-08-13 VITALS — BP 118/80 | HR 94 | Ht 68.0 in | Wt 141.0 lb

## 2022-08-13 DIAGNOSIS — K5900 Constipation, unspecified: Secondary | ICD-10-CM

## 2022-08-13 DIAGNOSIS — N816 Rectocele: Secondary | ICD-10-CM

## 2022-08-13 DIAGNOSIS — K4090 Unilateral inguinal hernia, without obstruction or gangrene, not specified as recurrent: Secondary | ICD-10-CM | POA: Diagnosis not present

## 2022-08-13 MED ORDER — POLYETHYLENE GLYCOL 3350 17 G PO PACK: 17.0000 g | PACK | Freq: Every day | ORAL | 0 refills | Status: DC | PRN

## 2022-08-13 NOTE — Patient Instructions (Addendum)
If your blood pressure at your visit was 140/90 or greater, please contact your primary care physician to follow up on this.  _______________________________________________________  If you are age 52 or older, your body mass index should be between 23-30. Your Body mass index is 21.44 kg/m. If this is out of the aforementioned range listed, please consider follow up with your Primary Care Provider.  If you are age 34 or younger, your body mass index should be between 19-25. Your Body mass index is 21.44 kg/m. If this is out of the aformentioned range listed, please consider follow up with your Primary Care Provider.   ________________________________________________________  The  GI providers would like to encourage you to use [facility] to communicate with providers for non-urgent requests or questions.  Due to long hold times on the telephone, sending your provider a message by [facility] may be a faster and more efficient way to get a response.  Please allow 48 business hours for a response.  Please remember that this is for non-urgent requests.  _______________________________________________________  Please purchase the following medications over the counter and take as directed: Miralax: Take once daily  We are referring you to [facility] located at [address], [place], [place] [address].  They will contact you directly to schedule an appointment.  It may take a week or more before you hear from them.  Please feel free to contact us if you have not heard from them within 2 weeks and we will follow up on the referral.  Their number is  [phone].  Thank you for entrusting me with your care and for choosing [employer], Ellouise Newer, P.A. - C.

## 2022-08-13 NOTE — Progress Notes (Signed)
Chief Complaint: IBS  HPI:    Kathryn Roach is a 52 year old female with past medical history as listed below including IBS, rectocele, and generalized anxiety disorder, known to Dr. Lyndel Safe, who returns to clinic today for follow-up of her IBS.    02/28/2018 patient seen in clinic by Dr. Lyndel Safe for IBS with alternating diarrhea and constipation, rectal bleeding and a family history of polyps.  At that point recommended for a colonoscopy and discussed proceeding with [facility] workup.    04/14/2018 colonoscopy with Dr. Lyndel Safe for a family history of advanced colon polyps in a first-degree relative and IBS as well as a history of rectal bleeding with minimal rectal erythema-likely due to preparation, healed posterior anal fissure, small internal hemorrhoids and otherwise normal.  Repeat recommended in 5 years due to strong family history of advanced colonic polyps.    Today, the patient presents to clinic and tells me that over the past few months she has noticed a worsening of her chronic constipation.  And when she tries to defecate she has some pain.  Tells me oftentimes she will have to insert her finger in her vagina in order to push the stool out of her rectum.  She does have a history of a rectocele but this was never repaired.  She has had 3 children vaginally.  Also describes that her stool is typically pebbles or sometimes just strands and she has a lot of gas and discomfort related to this.  She took MiraLAX 1 time last week and this really helped.  She never takes anything on a daily basis.    Also concerning to the patient is a right inguinal hernia which is painful over the past year, worse if she tries to pick anything up, cough or laugh.  She often finds herself holding her hand over this area so it does not cause a problem.    Also describes a 10 pound weight loss without really trying over the past few months.    Denies fever, chills, blood in her stool, nausea or vomiting.     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     FLUoxetine (PROZAC) 40 MG capsule TAKE 1 CAPSULE(40 MG) BY MOUTH DAILY 30 capsule 0   magnesium gluconate (MAGONATE) 500 MG tablet Take 500 mg by mouth daily.     vitamin B-12 (CYANOCOBALAMIN) 500 MCG tablet Take 500 mcg by mouth daily.     Vitamin D, Ergocalciferol, (DRISDOL) 50000 units CAPS capsule Take 1 capsule by mouth once a week.     Current Facility-Administered Medications  Medication Dose Route Frequency Provider Last Rate Last Admin   triamcinolone acetonide (KENALOG-40) injection 80 mg  80 mg Intramuscular Once Lillard Anes, MD        Allergies as of 08/13/2022 - Review Complete 03/06/2022  Allergen Reaction Noted   Dexamethasone  12/22/2021   Flexeril [cyclobenzaprine] Rash 02/28/2018   Kenalog [triamcinolone] Itching 05/02/2021   Prednisone Rash 09/19/2015    Family History  Problem Relation Age of Onset   Uterine cancer Mother    Cervical cancer Mother    Colon polyps Father        everyone on dad side has colon polyps per pt   High blood pressure Father    High Cholesterol Father    Skin cancer Sister    Heart attack Paternal Grandfather    Clotting disorder Daughter    Clotting disorder Paternal Aunt    Colon cancer Neg Hx    Esophageal cancer Neg Hx     Social History   Socioeconomic History   Marital status: Divorced    Spouse name: Not on file   Number of children: 5   Years of education: Not on file   Highest education level: Not on file   Occupational History   Occupation: Pharmacist, hospital  Tobacco Use   Smoking status: Never   Smokeless tobacco: Never  Vaping Use   Vaping Use: Never used  Substance and Sexual Activity   Alcohol use: Never   Drug use: Never   Sexual activity: Not Currently  Other Topics Concern   Not on file  Social History Narrative   Not on file   Social Determinants of Health   Financial Resource Strain: Not on file  Food Insecurity: Not on file  Transportation Needs: Not on file  Physical Activity: Not on file  Stress: Not on file  Social Connections: Not on file  Intimate Partner Violence: Not on file    Review of Systems:    Constitutional: No fever or chills Skin: No rash  Cardiovascular: No chest pain Respiratory: No SOB  Gastrointestinal: See HPI and otherwise negative Genitourinary: No dysuria  Neurological: No headache, dizziness or syncope Musculoskeletal: No new muscle or joint pain Hematologic: No bleeding or bruising Psychiatric: No history of depression or anxiety   Physical Exam:  Vital signs: BP 118/80   Pulse 94   Ht 5' 8"$  (1.727 m)   Wt 141 lb (64 kg)   BMI 21.44 kg/m    Constitutional:   Pleasant Caucasian female appears to be in NAD, Well developed, Well nourished, alert and cooperative Head:  Normocephalic and atraumatic. Eyes:   PEERL, EOMI. No icterus. Conjunctiva pink. Ears:  Normal auditory acuity. Neck:  Supple Throat: Oral cavity and pharynx without inflammation, swelling or lesion.  Respiratory: Respirations even and unlabored. Lungs clear to auscultation bilaterally.   No wheezes, crackles, or rhonchi.  Cardiovascular: Normal S1, S2. No MRG. Regular rate and rhythm. No peripheral edema, cyanosis or pallor.  Gastrointestinal:  Soft, nondistended, nontender. No rebound or guarding. Normal bowel sounds. No appreciable masses or hepatomegaly.+right inguinal hernia, reducible, some tenderness Rectal:  Declined Msk:  Symmetrical without gross deformities.  Without edema, no deformity or joint abnormality.  Neurologic:  Alert and  oriented x4;  grossly normal neurologically.  Skin:   Dry and intact without significant lesions or rashes. Psychiatric: Demonstrates good judgement and reason without abnormal affect or behaviors.  NO recent labs or imaging.  Assessment: 1. Right inguinal hernia: Tender to palpation, bothering her more over the past year 2. Rectocele: Sometimes has to insert her finger vaginally in order to push on her rectal wall to have a bowel movement, this has been getting worse with increasing constipation and discomfort in this area 3. Constipation: With above as well as history of IBS  Plan: 1.  Referred patient to [facility] for further evaluation of right inguinal hernia and rectocele. 2.  Discussed adding MiraLAX  daily, discussed titration of this up to 4 times a day if needed for constipation.  Hopefully just this will help with all of her issues. 3.  Patient will be due for colonoscopy in October of this year for family history of colon polyps 4.  Patient to follow in clinic per recommendations after seeing surgery or in October for colonoscopy.  Ellouise Newer, PA-C [facility] 08/13/2022, 9:36 AM

## 2022-08-18 NOTE — Progress Notes (Signed)
Agree with assessment/plan.  Raj Alzada Brazee, MD [facility] [phone]  

## 2022-10-25 ENCOUNTER — Telehealth: Payer: Self-pay

## 2022-10-25 NOTE — Telephone Encounter (Signed)
Patient has appointment with [facility] on 5-3 at 3:40pm with Dr. Fredricka Bonine

## 2023-05-07 ENCOUNTER — Encounter: Payer: Self-pay | Admitting: Gastroenterology

## 2023-07-01 ENCOUNTER — Ambulatory Visit: Payer: 59

## 2023-07-01 VITALS — BP 128/78 | HR 85 | Temp 97.8°F | Ht 68.0 in | Wt 142.0 lb

## 2023-07-01 DIAGNOSIS — R519 Headache, unspecified: Secondary | ICD-10-CM

## 2023-07-01 NOTE — Assessment & Plan Note (Signed)
 Intermittent headache for four days, initially thought to be hormonal but now centered in the face. Differential includes tension headache, hormonal headache, and sinusitis. No signs of severe pathology or bacterial sinusitis. Blood pressure normal, physical exam unremarkable. Discussed supportive treatment with ibuprofen and decongestants, and potential antibiotics if symptoms worsen or new symptoms develop. - Continue ibuprofen - Add decongestant for a few days - Stay well hydrated - Rest - Monitor for worsening symptoms such as thick mucus, severe pain, or new symptoms like chills or body aches or if the headache gets worse and turns out to be a severe headache,  - Consider antibiotics if symptoms suggest bacterial sinusitis  Fatigue Persistent fatigue likely related to headache and possible viral exposure. No fever or body aches. Physical exam unremarkable. Discussed rest and hydration as primary management. - Rest - Stay well hydrated - Monitor for new symptoms such as fever or body aches  Follow-up - Call back if symptoms worsen or new symptoms develop - Consider follow-up if symptoms persist or worsen.

## 2023-07-01 NOTE — Progress Notes (Signed)
 Acute Office Visit  Subjective:    Patient ID: Kathryn Roach, female    DOB: 1971/02/02, 53 y.o.   MRN: [id number]  Chief Complaint  Patient presents with   Sinusitis    Discussed the use of AI scribe software for clinical note transcription with the patient, who gave verbal consent to proceed.      HPI: Patient is in today for sinus issues. Symptoms started last Thursday. The patient, a 53 year old with a history of sinusitis and hormonal headaches, presents with a four-day history of a persistent headache. The headache initially started at the front of the head, then moved to the back, and is now localized to the face. The patient describes the headache as different from her usual hormonal headaches, which typically occur at the beginning and end of her menstrual cycle. The patient also reports fatigue and has been sleeping for the past three days.  The patient has been taking ibuprofen for the headache, but it has not provided significant relief. She has also noticed some nasal drainage, but denies having a stuffy or runny nose. The patient does not report any other symptoms such as fever or body aches.  The patient recently traveled to visit her granddaughter, who had a cold at the time. The patient denies any significant changes in her health prior to the onset of the current symptoms. She has not started any new medications and has not reported any changes in her usual health habits.  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 She also expresses concern about the possibility of a sinus infection, as she has had one in the past that initially presented with dental pain.  See's gynecology tomorrow state she will have them order the mammogram and colonoscopy.  Past Medical History:  Diagnosis Date   Anal fissure    Anxiety    Attention-deficit hyperactivity  disorder, predominantly inattentive type 10/21/2019   Generalized anxiety disorder 10/21/2019   IBS (irritable bowel syndrome)     Past Surgical History:  Procedure Laterality Date   HYSTEROSCOPY WITH D & C     NECK SURGERY  2010   disk that was pressing  spinal column    Family History  Problem Relation Age of Onset   Uterine cancer Mother    Cervical cancer Mother    Colon polyps Father        everyone on dad side has colon polyps per pt   High blood pressure Father    High Cholesterol Father    Skin cancer Sister    Heart attack Paternal Grandfather    Clotting disorder Daughter    Clotting disorder Paternal Aunt    Colon cancer Neg Hx    Esophageal cancer Neg Hx     Social History   Socioeconomic History   Marital status: Divorced    Spouse name: Not on file   Number of children: 5   Years of education: Not on file   Highest education level: Not on file  Occupational History   Occupation: Runner, Broadcasting/film/video  Tobacco Use   Smoking status: Never   Smokeless tobacco: Never  Vaping Use   Vaping status: Never Used  Substance and Sexual Activity   Alcohol use: Never   Drug use: Never   Sexual activity: Not Currently  Other Topics Concern   Not on file  Social History Narrative   Not on  file   Social Drivers of Health   Financial Resource Strain: Low Risk  (07/01/2023)   Overall Financial Resource Strain (CARDIA)    Difficulty of Paying Living Expenses: Not hard at all  Food Insecurity: No Food Insecurity (07/01/2023)   Hunger Vital Sign    Worried About Running Out of Food in the Last Year: Never true    Ran Out of Food in the Last Year: Never true  Transportation Needs: No Transportation Needs (07/01/2023)   PRAPARE - Administrator, Civil Service (Medical): No    Lack of Transportation (Non-Medical): No  Physical Activity: Inactive (07/01/2023)   Exercise Vital Sign    Days of Exercise per Week: 0 days    Minutes of Exercise per Session: 0 min  Stress: No  Stress Concern Present (07/01/2023)   [employer] of Occupational Health - Occupational Stress Questionnaire    Feeling of Stress : Not at all  Social Connections: Moderately Isolated (07/01/2023)   Social Connection and Isolation Panel [NHANES]    Frequency of Communication with Friends and Family: More than three times a week    Frequency of Social Gatherings with Friends and Family: More than three times a week    Attends Religious Services: More than 4 times per year    Active Member of [employer] or Organizations: No    Attends Banker Meetings: Never    Marital Status: Divorced  Catering Manager Violence: Not At Risk (07/01/2023)   Humiliation, Afraid, Rape, and Kick questionnaire    Fear of Current or Ex-Partner: No    Emotionally Abused: No    Physically Abused: No    Sexually Abused: No    Outpatient Medications Prior to Visit  Medication Sig Dispense Refill   Ascorbic Acid (VITAMIN C) 1000 MG tablet Take 1,000 mg by mouth daily.     Multiple Vitamin (MULTI-VITAMIN) tablet Take by mouth.     POTASSIUM BICARBONATE PO Take by mouth.     Prenatal MV-Min-Fe Fum-FA-DHA (PRENATAL 1 PO) Take by mouth.     busPIRone (BUSPAR) 5 MG tablet Take 5 mg by mouth 2 (two) times daily.     Calcium-Magnesium-Vitamin D (CALCIUM 1200+D3 PO) Take by mouth.     FLUoxetine  (PROZAC ) 40 MG capsule TAKE 1 CAPSULE(40 MG) BY MOUTH DAILY 30 capsule 0   magnesium gluconate (MAGONATE) 500 MG tablet Take 500 mg by mouth daily.     polyethylene glycol (MIRALAX ) 17 g packet Take 17 g by mouth daily as needed. 14 each 0   vitamin B-12 (CYANOCOBALAMIN) 500 MCG tablet Take 500 mcg by mouth daily.     Facility-Administered Medications Prior to Visit  Medication Dose Route Frequency Provider Last Rate Last Admin   triamcinolone  acetonide (KENALOG -40) injection 80 mg  80 mg Intramuscular Once Abran Jerilynn Loving, MD        Allergies  Allergen Reactions   Dexamethasone     Rash   Flexeril  [Cyclobenzaprine] Rash    Gets a rash   Kenalog  [Triamcinolone ] Itching   Prednisone Rash    Unable to see    Review of Systems  Constitutional:  Positive for fatigue. Negative for chills and fever.  HENT:  Positive for congestion, ear pain (Feel full), postnasal drip, rhinorrhea, sinus pressure, sinus pain and sore throat (from PND).   Respiratory:  Negative for cough and shortness of breath.   Gastrointestinal:  Negative for diarrhea, nausea and vomiting.  Neurological:  Positive for headaches. Negative for dizziness.  Objective:        07/01/2023    2:01 PM 08/13/2022    9:36 AM 03/06/2022    9:33 AM  Vitals with BMI  Height 5' 8 5' 8 5' 8  Weight 142 lbs 141 lbs 144 lbs 2 oz  BMI 21.6 21.44 21.92  Systolic 128 118 875  Diastolic 78 80 82  Pulse 85 94 76    No data found.   Physical Exam Vitals and nursing note reviewed.  Constitutional:      General: She is not in acute distress.    Appearance: She is not ill-appearing, toxic-appearing or diaphoretic.  HENT:     Head:     Comments: HEENT: Ears without abnormalities. Nostril drainage without abnormalities. Oral cavity without white patches or spots on tonsils. Sinuses non-tender. Temporal area tender on deep palpation but no superficial tenderness to touch NECK: No lymphadenopathy. CHEST: Lungs clear. CARDIOVASCULAR: Heart sounds normal. Musculoskeletal:        General: Normal range of motion.  Skin:    General: Skin is warm.  Neurological:     General: No focal deficit present.     Mental Status: She is alert and oriented to person, place, and time. Mental status is at baseline.  Psychiatric:        Mood and Affect: Mood normal.     Health Maintenance Due  Topic Date Due   HIV Screening  Never done   Hepatitis C Screening  Never done   MAMMOGRAM  06/26/2015   DTaP/Tdap/Td (2 - Td or Tdap) 11/08/2020   Colonoscopy  04/15/2023    There are no preventive care reminders to display for this  patient.   No results found for: TSH Lab Results  Component Value Date   WBC 8.3 12/18/2007   HGB 13.7 12/18/2007   HCT 39.5 12/18/2007   MCV 88.7 12/18/2007   PLT 325 12/18/2007   No results found for: NA, K, CHLORIDE, CO2, GLUCOSE, BUN, CREATININE, BILITOT, ALKPHOS, AST, ALT, PROT, ALBUMIN, CALCIUM, ANIONGAP, EGFR, GFR No results found for: CHOL No results found for: HDL No results found for: LDLCALC No results found for: TRIG No results found for: CHOLHDL No results found for: YHAJ8R     Assessment & Plan:  Acute nonintractable headache, unspecified headache type Assessment & Plan: Intermittent headache for four days, initially thought to be hormonal but now centered in the face. Differential includes tension headache, hormonal headache, and sinusitis. No signs of severe pathology or bacterial sinusitis. Blood pressure normal, physical exam unremarkable. Discussed supportive treatment with ibuprofen and decongestants, and potential antibiotics if symptoms worsen or new symptoms develop. - Continue ibuprofen - Add decongestant for a few days - Stay well hydrated - Rest - Monitor for worsening symptoms such as thick mucus, severe pain, or new symptoms like chills or body aches or if the headache gets worse and turns out to be a severe headache,  - Consider antibiotics if symptoms suggest bacterial sinusitis  Fatigue Persistent fatigue likely related to headache and possible viral exposure. No fever or body aches. Physical exam unremarkable. Discussed rest and hydration as primary management. - Rest - Stay well hydrated - Monitor for new symptoms such as fever or body aches  Follow-up - Call back if symptoms worsen or new symptoms develop - Consider follow-up if symptoms persist or worsen.      No orders of the defined types were placed in this encounter.   No orders of the defined types were placed  in this encounter.     Follow-up: No follow-ups on file.  An After Visit Summary was printed and given to the patient.  Javante Nilsson, MD [facility] [phone]

## 2023-11-24 HISTORY — PX: PARTIAL HYSTERECTOMY: SHX80

## 2023-11-24 HISTORY — PX: INGUINAL HERNIA REPAIR: SUR1180

## 2024-03-30 ENCOUNTER — Ambulatory Visit

## 2024-03-30 VITALS — Ht 68.0 in | Wt 140.0 lb

## 2024-03-30 DIAGNOSIS — Z800 Family history of malignant neoplasm of digestive organs: Secondary | ICD-10-CM

## 2024-03-30 MED ORDER — NA SULFATE-K SULFATE-MG SULF 17.5-3.13-1.6 GM/177ML PO SOLN: 1.0000 | Freq: Once | ORAL | 0 refills | Status: AC

## 2024-03-30 NOTE — Progress Notes (Signed)
 Pre visit completed via phone call; Patient verified name, DOB, and address; No egg or soy allergy known to patient  No issues known to pt with past sedation with any surgeries or procedures Patient denies ever being told they had issues or difficulty with intubation  No FH of Malignant Hyperthermia Pt is not on diet pills Pt is not on home 02  Pt is not on blood thinners  Pt denies issues with constipation  No A fib or A flutter Have any cardiac testing pending--NO Insurance verified during PV appt--- [employer] [facility] Pt can ambulate without assistance;  Pt denies use of chewing tobacco Discussed diabetic/weight loss medication holds; Discussed NSAID holds; Checked BMI to be less than 50; Pt instructed to use Singlecare.com or GoodRx for a price reduction on prep  Patient's chart reviewed by Norleen Schillings CNRA prior to previsit and patient appropriate for the [facility].  Pre visit completed and red dot placed by patient's name on their procedure day (on provider's schedule).   Instructions sent to MyChart per patient request;

## 2024-03-31 ENCOUNTER — Other Ambulatory Visit: Payer: Self-pay

## 2024-03-31 ENCOUNTER — Encounter: Payer: Self-pay | Admitting: Gastroenterology

## 2024-03-31 MED ORDER — NA SULFATE-K SULFATE-MG SULF 17.5-3.13-1.6 GM/177ML PO SOLN: 1.0000 | Freq: Once | ORAL | 0 refills | Status: AC

## 2024-04-09 ENCOUNTER — Encounter: Payer: Self-pay | Admitting: Gastroenterology

## 2024-04-16 ENCOUNTER — Encounter: Payer: Self-pay | Admitting: Gastroenterology

## 2024-04-16 ENCOUNTER — Ambulatory Visit (AMBULATORY_SURGERY_CENTER): Admitting: Gastroenterology

## 2024-04-16 VITALS — BP 148/94 | HR 78 | Temp 98.4°F | Resp 14 | Ht 68.0 in | Wt 140.0 lb

## 2024-04-16 DIAGNOSIS — Z1211 Encounter for screening for malignant neoplasm of colon: Secondary | ICD-10-CM | POA: Diagnosis present

## 2024-04-16 DIAGNOSIS — Z800 Family history of malignant neoplasm of digestive organs: Secondary | ICD-10-CM | POA: Diagnosis not present

## 2024-04-16 DIAGNOSIS — K635 Polyp of colon: Secondary | ICD-10-CM | POA: Diagnosis not present

## 2024-04-16 DIAGNOSIS — K5730 Diverticulosis of large intestine without perforation or abscess without bleeding: Secondary | ICD-10-CM | POA: Diagnosis not present

## 2024-04-16 DIAGNOSIS — K640 First degree hemorrhoids: Secondary | ICD-10-CM | POA: Diagnosis not present

## 2024-04-16 DIAGNOSIS — D124 Benign neoplasm of descending colon: Secondary | ICD-10-CM

## 2024-04-16 MED ORDER — SODIUM CHLORIDE 0.9 % IV SOLN: 500.0000 mL | INTRAVENOUS | Status: DC

## 2024-04-16 NOTE — Op Note (Signed)
 [facility] Patient Name: Kathryn Roach Procedure Date: 04/16/2024 2:59 PM MRN: [id number] Endoscopist: Lynnie Bring , MD, [id number] Age: 53 Referring MD:  Date of Birth: Nov 13, 1970 Gender: Female Account #: [id number] Procedure:                Colonoscopy Indications:              Colon cancer screening in patient at increased                            risk: Family history of 1st- degree relative with                            colon polyps before age 38 years. Medicines:                Monitored Anesthesia Care Procedure:                Pre-Anesthesia Assessment:                           - Prior to the procedure, a History and Physical                            was performed, and patient medications and                            allergies were reviewed. The patient's tolerance of                            previous anesthesia was also reviewed. The risks                            and benefits of the procedure and the sedation                            options and risks were discussed with the patient.                            All questions were answered, and informed consent                            was obtained. Prior Anticoagulants: The patient has                            taken no anticoagulant or antiplatelet agents. ASA                            Grade Assessment: II - A patient with mild systemic                            disease. After reviewing the risks and benefits,                            the patient was deemed in satisfactory condition to  undergo the procedure.                           After obtaining informed consent, the colonoscope                            was passed under direct vision. Throughout the                            procedure, the patient's blood pressure, pulse, and                            oxygen saturations were monitored continuously. The                            Olympus Scope SN  [id number] was introduced through the                            anus and advanced to the 2 cm into the ileum. The                            colonoscopy was performed without difficulty. The                            patient tolerated the procedure well. The quality                            of the bowel preparation was adequate to identify                            polyps. The terminal ileum, ileocecal valve,                            appendiceal orifice, and rectum were photographed. Scope In: 3:42:34 PM Scope Out: 4:03:30 PM Scope Withdrawal Time: 0 hours 11 minutes 59 seconds  Total Procedure Duration: 0 hours 20 minutes 56 seconds  Findings:                 A 6 mm polyp was found in the proximal descending                            colon. The polyp was sessile. The polyp was removed                            with a cold snare. Resection and retrieval were                            complete.                           A few small-mouthed diverticula were found in the                            sigmoid colon.  Non-bleeding internal hemorrhoids were found during                            retroflexion. The hemorrhoids were small and Grade                            I (internal hemorrhoids that do not prolapse).                           The terminal ileum appeared normal.                           Retroflexion in the right colon was performed.                           The exam was otherwise without abnormality on                            direct and retroflexion views. Complications:            No immediate complications. Estimated Blood Loss:     Estimated blood loss: none. Impression:               - One 6 mm polyp in the proximal descending colon,                            removed with a cold snare. Resected and retrieved.                           - Mild sigmoid diverticulosis.                           - Non-bleeding internal hemorrhoids.                            - The examined portion of the ileum was normal.                           - The examination was otherwise normal on direct                            and retroflexion views. Recommendation:           - Patient has a contact number available for                            emergencies. The signs and symptoms of potential                            delayed complications were discussed with the                            patient. Return to normal activities tomorrow.                            Written discharge instructions were provided to  the                            patient.                           - Resume previous diet.                           - Continue present medications.                           - Await pathology results.                           - Repeat colonoscopy for surveillance based on                            pathology results.                           - The findings and recommendations were discussed                            with the patient's family. Lynnie Bring, MD 04/16/2024 4:09:00 PM This report has been signed electronically.

## 2024-04-16 NOTE — Progress Notes (Signed)
 Report to PACU, RN, vss, BBS= Clear.

## 2024-04-16 NOTE — Progress Notes (Signed)
 Called to room to assist during endoscopic procedure.  Patient ID and intended procedure confirmed with present staff. Received instructions for my participation in the procedure from the performing physician.

## 2024-04-16 NOTE — Progress Notes (Signed)
 Chief Complaint: IBS   HPI:    Kathryn Roach is a 53 year old female with past medical history as listed below including IBS, rectocele, and generalized anxiety disorder, known to Dr. Charlanne, who returns to clinic today for follow-up of her IBS.    02/28/2018 patient seen in clinic by Dr. Charlanne for IBS with alternating diarrhea and constipation, rectal bleeding and a family history of polyps.  At that point recommended for a colonoscopy and discussed proceeding with [facility] workup.    04/14/2018 colonoscopy with Dr. Charlanne for a family history of advanced colon polyps in a first-degree relative and IBS as well as a history of rectal bleeding with minimal rectal erythema-likely due to preparation, healed posterior anal fissure, small internal hemorrhoids and otherwise normal.  Repeat recommended in 5 years due to strong family history of advanced colonic polyps.    Today, the patient presents to clinic and tells me that over the past few months she has noticed a worsening of her chronic constipation.  And when she tries to defecate she has some pain.  Tells me oftentimes she will have to insert her finger in her vagina in order to push the stool out of her rectum.  She does have a history of a rectocele but this was never repaired.  She has had 3 children vaginally.  Also describes that her stool is typically pebbles or sometimes just strands and she has a lot of gas and discomfort related to this.  She took MiraLAX  1 time last week and this really helped.  She never takes anything on a daily basis.    Also concerning to the patient is a right inguinal hernia which is painful over the past year, worse if she tries to pick anything up, cough or laugh.  She often finds herself holding her hand over this area so it does not cause a problem.    Also describes a 10 pound weight loss without really trying over the past few months.    Denies fever, chills, blood in her stool, nausea or vomiting.         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       FLUoxetine  (PROZAC ) 40 MG capsule TAKE 1 CAPSULE(40 MG) BY MOUTH DAILY 30 capsule 0   magnesium gluconate (MAGONATE) 500 MG tablet Take 500 mg by mouth daily.       vitamin B-12 (CYANOCOBALAMIN) 500 MCG tablet Take 500 mcg by mouth daily.       Vitamin D, Ergocalciferol, (DRISDOL) 50000 units CAPS capsule Take 1 capsule by mouth once a week.                   Current Facility-Administered Medications  Medication Dose Route Frequency Provider Last Rate Last Admin   triamcinolone  acetonide (KENALOG -40) injection 80  mg  80 mg Intramuscular Once Abran Jerilynn Loving, MD                 Allergies as of 08/13/2022 - Review Complete 03/06/2022  Allergen Reaction Noted   Dexamethasone   12/22/2021   Flexeril [cyclobenzaprine] Rash 02/28/2018   Kenalog  [triamcinolone ] Itching 05/02/2021   Prednisone Rash 09/19/2015           Family History  Problem Relation Age of Onset   Uterine cancer Mother     Cervical cancer Mother     Colon polyps Father          everyone on dad side has colon polyps per pt   High blood pressure Father     High Cholesterol Father     Skin cancer Sister     Heart attack Paternal Grandfather     Clotting disorder Daughter     Clotting disorder Paternal Aunt     Colon cancer Neg Hx     Esophageal cancer Neg Hx            Social History         Socioeconomic History   Marital status: Divorced      Spouse name: Not  on file   Number of children: 5   Years of education: Not on file   Highest education level: Not on file  Occupational History   Occupation: Runner, broadcasting/film/video  Tobacco Use   Smoking status: Never   Smokeless tobacco: Never  Vaping Use   Vaping Use: Never used  Substance and Sexual Activity   Alcohol use: Never   Drug use: Never   Sexual activity: Not Currently  Other Topics Concern   Not on file  Social History Narrative   Not on file    Social Determinants of Health    Financial Resource Strain: Not on file  Food Insecurity: Not on file  Transportation Needs: Not on file  Physical Activity: Not on file  Stress: Not on file  Social Connections: Not on file  Intimate Partner Violence: Not on file      Review of Systems:    Constitutional: No fever or chills Skin: No rash  Cardiovascular: No chest pain Respiratory: No SOB  Gastrointestinal: See HPI and otherwise negative Genitourinary: No dysuria  Neurological: No headache, dizziness or syncope Musculoskeletal: No new muscle or joint pain Hematologic: No bleeding or bruising Psychiatric: No history of depression or anxiety    Physical Exam:  Vital signs: BP 118/80   Pulse 94   Ht 5' 8 (1.727 m)   Wt 141 lb (64 kg)   BMI 21.44 kg/m     Constitutional:   Pleasant Caucasian female appears to be in NAD, Well developed, Well nourished, alert and cooperative Head:  Normocephalic and atraumatic. Eyes:   PEERL, EOMI. No icterus. Conjunctiva pink. Ears:  Normal auditory acuity. Neck:  Supple Throat: Oral cavity and pharynx without inflammation, swelling or lesion.  Respiratory: Respirations even and unlabored. Lungs clear to auscultation bilaterally.   No wheezes, crackles, or rhonchi.  Cardiovascular: Normal S1, S2. No MRG. Regular rate and rhythm. No peripheral edema, cyanosis or pallor.  Gastrointestinal:  Soft, nondistended, nontender. No rebound or guarding. Normal bowel sounds. No appreciable masses or  hepatomegaly.+right inguinal hernia, reducible, some tenderness Rectal:  Declined Msk:  Symmetrical without gross deformities. Without edema, no deformity or joint abnormality.  Neurologic:  Alert and  oriented x4;  grossly normal neurologically.  Skin:   Dry and intact  without significant lesions or rashes. Psychiatric: Demonstrates good judgement and reason without abnormal affect or behaviors.   NO recent labs or imaging.   Assessment: 1. Right inguinal hernia: Tender to palpation, bothering her more over the past year 2. Rectocele: Sometimes has to insert her finger vaginally in order to push on her rectal wall to have a bowel movement, this has been getting worse with increasing constipation and discomfort in this area 3. Constipation: With above as well as history of IBS   Plan: 1.  Referred patient to [facility] surgery for further evaluation of right inguinal hernia and rectocele. 2.  Discussed adding MiraLAX  daily, discussed titration of this up to 4 times a day if needed for constipation.  Hopefully just this will help with all of her issues. 3.  Patient will be due for colonoscopy in October of this year for family history of colon polyps 4.  Patient to follow in clinic per recommendations after seeing surgery or in October for colonoscopy.   Delon Failing, PA-C [facility]    Attending physician's note   I have taken history, reviewed the chart and examined the patient. I performed a substantive portion of this encounter, including complete performance of at least one of the key components, in conjunction with the APP. I agree with the Advanced Practitioner's note, impression and recommendations.    Anselm Bring, MD [facility] [phone]

## 2024-04-16 NOTE — Progress Notes (Signed)
 Pt's states no medical or surgical changes since previsit or office visit.

## 2024-04-16 NOTE — Patient Instructions (Signed)
 Thank you for letting us take care of your healthcare needs today. Please see handouts given to you on Polyps, Diverticulosis and Hemorrhoids.     YOU HAD AN ENDOSCOPIC PROCEDURE TODAY AT THE [facility]:   Refer to the procedure report that was given to you for any specific questions about what was found during the examination.  If the procedure report does not answer your questions, please call your gastroenterologist to clarify.  If you requested that your care partner not be given the details of your procedure findings, then the procedure report has been included in a sealed envelope for you to review at your convenience later.  YOU SHOULD EXPECT: Some feelings of bloating in the abdomen. Passage of more gas than usual.  Walking can help get rid of the air that was put into your GI tract during the procedure and reduce the bloating. If you had a lower endoscopy (such as a colonoscopy or flexible sigmoidoscopy) you may notice spotting of blood in your stool or on the toilet paper. If you underwent a bowel prep for your procedure, you may not have a normal bowel movement for a few days.  Please Note:  You might notice some irritation and congestion in your nose or some drainage.  This is from the oxygen used during your procedure.  There is no need for concern and it should clear up in a day or so.  SYMPTOMS TO REPORT IMMEDIATELY:  Following lower endoscopy (colonoscopy or flexible sigmoidoscopy):  Excessive amounts of blood in the stool  Significant tenderness or worsening of abdominal pains  Swelling of the abdomen that is new, acute  Fever of 100F or higher   For urgent or emergent issues, a gastroenterologist can be reached at any hour by calling [phone]. Do not use MyChart messaging for urgent concerns.    DIET:  We do recommend a small meal at first, but then you may proceed to your regular diet.  Drink plenty of fluids but you should avoid alcoholic beverages  for 24 hours.  ACTIVITY:  You should plan to take it easy for the rest of today and you should NOT DRIVE or use heavy machinery until tomorrow (because of the sedation medicines used during the test).    FOLLOW UP: Our staff will call the number listed on your records the next business day following your procedure.  We will call around 7:15- 8:00 am to check on you and address any questions or concerns that you may have regarding the information given to you following your procedure. If we do not reach you, we will leave a message.     If any biopsies were taken you will be contacted by phone or by letter within the next 1-3 weeks.  Please call us at [phone] if you have not heard about the biopsies in 3 weeks.    SIGNATURES/CONFIDENTIALITY: You and/or your care partner have signed paperwork which will be entered into your electronic medical record.  These signatures attest to the fact that that the information above on your After Visit Summary has been reviewed and is understood.  Full responsibility of the confidentiality of this discharge information lies with you and/or your care-partner.

## 2024-04-17 ENCOUNTER — Telehealth: Payer: Self-pay

## 2024-04-17 NOTE — Telephone Encounter (Signed)
Attempted to reach patient for post-procedure f/u call. No answer. Left message for her to please not hesitate to call if she has any questions/concerns regarding her care. 

## 2024-04-21 LAB — SURGICAL PATHOLOGY

## 2024-04-26 ENCOUNTER — Ambulatory Visit: Payer: Self-pay | Admitting: Gastroenterology
